# Patient Record
Sex: Female | Born: 1975 | Race: White | Hispanic: No | Marital: Single | Smoking: Heavy tobacco smoker
Health system: Southern US, Community
[De-identification: ages and names within clinical notes are randomized; demographics above are authoritative.]

## PROBLEM LIST (undated history)

## (undated) DIAGNOSIS — M810 Age-related osteoporosis without current pathological fracture: Secondary | ICD-10-CM

## (undated) HISTORY — PX: ABDOMINAL HYSTERECTOMY: SHX81

---

## 2003-04-02 ENCOUNTER — Encounter: Payer: Self-pay | Admitting: Emergency Medicine

## 2003-04-02 ENCOUNTER — Emergency Department (HOSPITAL_COMMUNITY): Admission: EM | Admit: 2003-04-02 | Discharge: 2003-04-02 | Payer: Self-pay | Admitting: Emergency Medicine

## 2003-10-21 ENCOUNTER — Other Ambulatory Visit: Payer: Self-pay

## 2004-01-05 ENCOUNTER — Emergency Department (HOSPITAL_COMMUNITY): Admission: EM | Admit: 2004-01-05 | Discharge: 2004-01-05 | Payer: Self-pay | Admitting: Emergency Medicine

## 2004-11-17 ENCOUNTER — Ambulatory Visit: Payer: Self-pay | Admitting: Obstetrics and Gynecology

## 2004-12-03 ENCOUNTER — Emergency Department: Payer: Self-pay | Admitting: Emergency Medicine

## 2005-04-18 ENCOUNTER — Emergency Department: Payer: Self-pay | Admitting: Emergency Medicine

## 2006-07-08 ENCOUNTER — Emergency Department: Payer: Self-pay | Admitting: Emergency Medicine

## 2006-09-10 ENCOUNTER — Emergency Department: Payer: Self-pay | Admitting: Emergency Medicine

## 2007-01-31 ENCOUNTER — Emergency Department: Payer: Self-pay | Admitting: Unknown Physician Specialty

## 2007-04-17 ENCOUNTER — Emergency Department: Payer: Self-pay

## 2007-07-03 ENCOUNTER — Emergency Department: Payer: Self-pay | Admitting: Emergency Medicine

## 2008-12-08 ENCOUNTER — Emergency Department: Payer: Self-pay | Admitting: Emergency Medicine

## 2008-12-22 ENCOUNTER — Ambulatory Visit: Payer: Self-pay | Admitting: Family

## 2009-01-31 ENCOUNTER — Ambulatory Visit: Payer: Self-pay | Admitting: Family

## 2009-02-03 ENCOUNTER — Ambulatory Visit: Payer: Self-pay | Admitting: Family

## 2009-03-31 ENCOUNTER — Other Ambulatory Visit: Payer: Self-pay | Admitting: Family

## 2009-04-13 ENCOUNTER — Ambulatory Visit: Payer: Self-pay | Admitting: Family

## 2009-04-15 ENCOUNTER — Ambulatory Visit: Payer: Self-pay | Admitting: Family

## 2009-05-26 ENCOUNTER — Ambulatory Visit: Payer: Self-pay | Admitting: Family

## 2009-07-01 ENCOUNTER — Ambulatory Visit: Payer: Self-pay | Admitting: Internal Medicine

## 2009-07-14 ENCOUNTER — Emergency Department: Payer: Self-pay | Admitting: Emergency Medicine

## 2010-07-07 ENCOUNTER — Ambulatory Visit: Payer: Self-pay | Admitting: Internal Medicine

## 2011-06-11 ENCOUNTER — Telehealth: Payer: Self-pay | Admitting: Internal Medicine

## 2011-06-11 DIAGNOSIS — L819 Disorder of pigmentation, unspecified: Secondary | ICD-10-CM

## 2011-06-11 NOTE — Telephone Encounter (Signed)
Referral in EPIC,.  [facility].

## 2011-06-11 NOTE — Telephone Encounter (Signed)
Pt would like to be referred to dermatology she has white spots on her back.  She said she spoke to you about this at [facility].  Pt will go sign medical release form

## 2011-06-11 NOTE — Telephone Encounter (Signed)
Patient is asking for a referral to dermatologist for white spots on her back. She says that she has talked about this with you over at [facility].

## 2011-06-11 NOTE — Telephone Encounter (Signed)
Referral ordered in epic for dermatologic evaluation requested by patient.  She has not been seen in this clinic yet but is transferring from old practice.  Pls refer to [facility]  Reason : hypopigmented skin lesions thanks

## 2011-06-25 ENCOUNTER — Encounter: Payer: Self-pay | Admitting: Internal Medicine

## 2011-11-28 DIAGNOSIS — G8929 Other chronic pain: Secondary | ICD-10-CM | POA: Insufficient documentation

## 2011-11-28 DIAGNOSIS — M549 Dorsalgia, unspecified: Secondary | ICD-10-CM | POA: Insufficient documentation

## 2011-12-04 ENCOUNTER — Ambulatory Visit: Payer: Self-pay | Admitting: Specialist

## 2012-01-02 ENCOUNTER — Ambulatory Visit: Payer: Self-pay | Admitting: Pain Medicine

## 2012-01-03 ENCOUNTER — Ambulatory Visit: Payer: Self-pay | Admitting: Pain Medicine

## 2012-01-07 ENCOUNTER — Ambulatory Visit: Payer: Self-pay | Admitting: Pain Medicine

## 2013-02-24 ENCOUNTER — Emergency Department: Payer: Self-pay | Admitting: Internal Medicine

## 2013-02-24 LAB — URINALYSIS, COMPLETE
Bacteria: NONE SEEN
Bilirubin,UR: NEGATIVE
Blood: NEGATIVE
Glucose,UR: NEGATIVE mg/dL (ref 0–75)
Ketone: NEGATIVE
Leukocyte Esterase: NEGATIVE
Nitrite: NEGATIVE
Ph: 9 (ref 4.5–8.0)
RBC,UR: 1 /HPF (ref 0–5)
Specific Gravity: 1.005 (ref 1.003–1.030)
Squamous Epithelial: NONE SEEN

## 2013-02-24 LAB — CBC
MCHC: 35.7 g/dL (ref 32.0–36.0)
Platelet: 280 10*3/uL (ref 150–440)
RBC: 4.65 10*6/uL (ref 3.80–5.20)

## 2013-02-24 LAB — PROTIME-INR: INR: 1

## 2013-02-24 LAB — COMPREHENSIVE METABOLIC PANEL
Albumin: 4.4 g/dL (ref 3.4–5.0)
Anion Gap: 6 — ABNORMAL LOW (ref 7–16)
Bilirubin,Total: 0.3 mg/dL (ref 0.2–1.0)
Calcium, Total: 9.7 mg/dL (ref 8.5–10.1)
Co2: 26 mmol/L (ref 21–32)
Creatinine: 1.07 mg/dL (ref 0.60–1.30)
EGFR (African American): >60
EGFR (Non-African Amer.): >60
Glucose: 101 mg/dL — ABNORMAL HIGH (ref 65–99)
Total Protein: 8.2 g/dL (ref 6.4–8.2)

## 2013-02-24 IMAGING — CR DG THORACIC SPINE 2-3V
1 series · 2 of 2 positions shown · non-contrast
Comparison: none

REASON FOR EXAM: UPPER BACK PAIN XRAY TSPINE
COMMENTS:

PROCEDURE:     DXR - DXR THORACIC  AP AND LATERAL  - January 07, 2012  [DATE]
RESULT:     Alignment of the thoracic spine is normal. The vertebral body
heights and intervertebral disc spaces appear normal. There does not appear
to be significant change.

[Series 1: ap · 0.17mm/px · 2 of 2 slices shown]
[im 1/2]
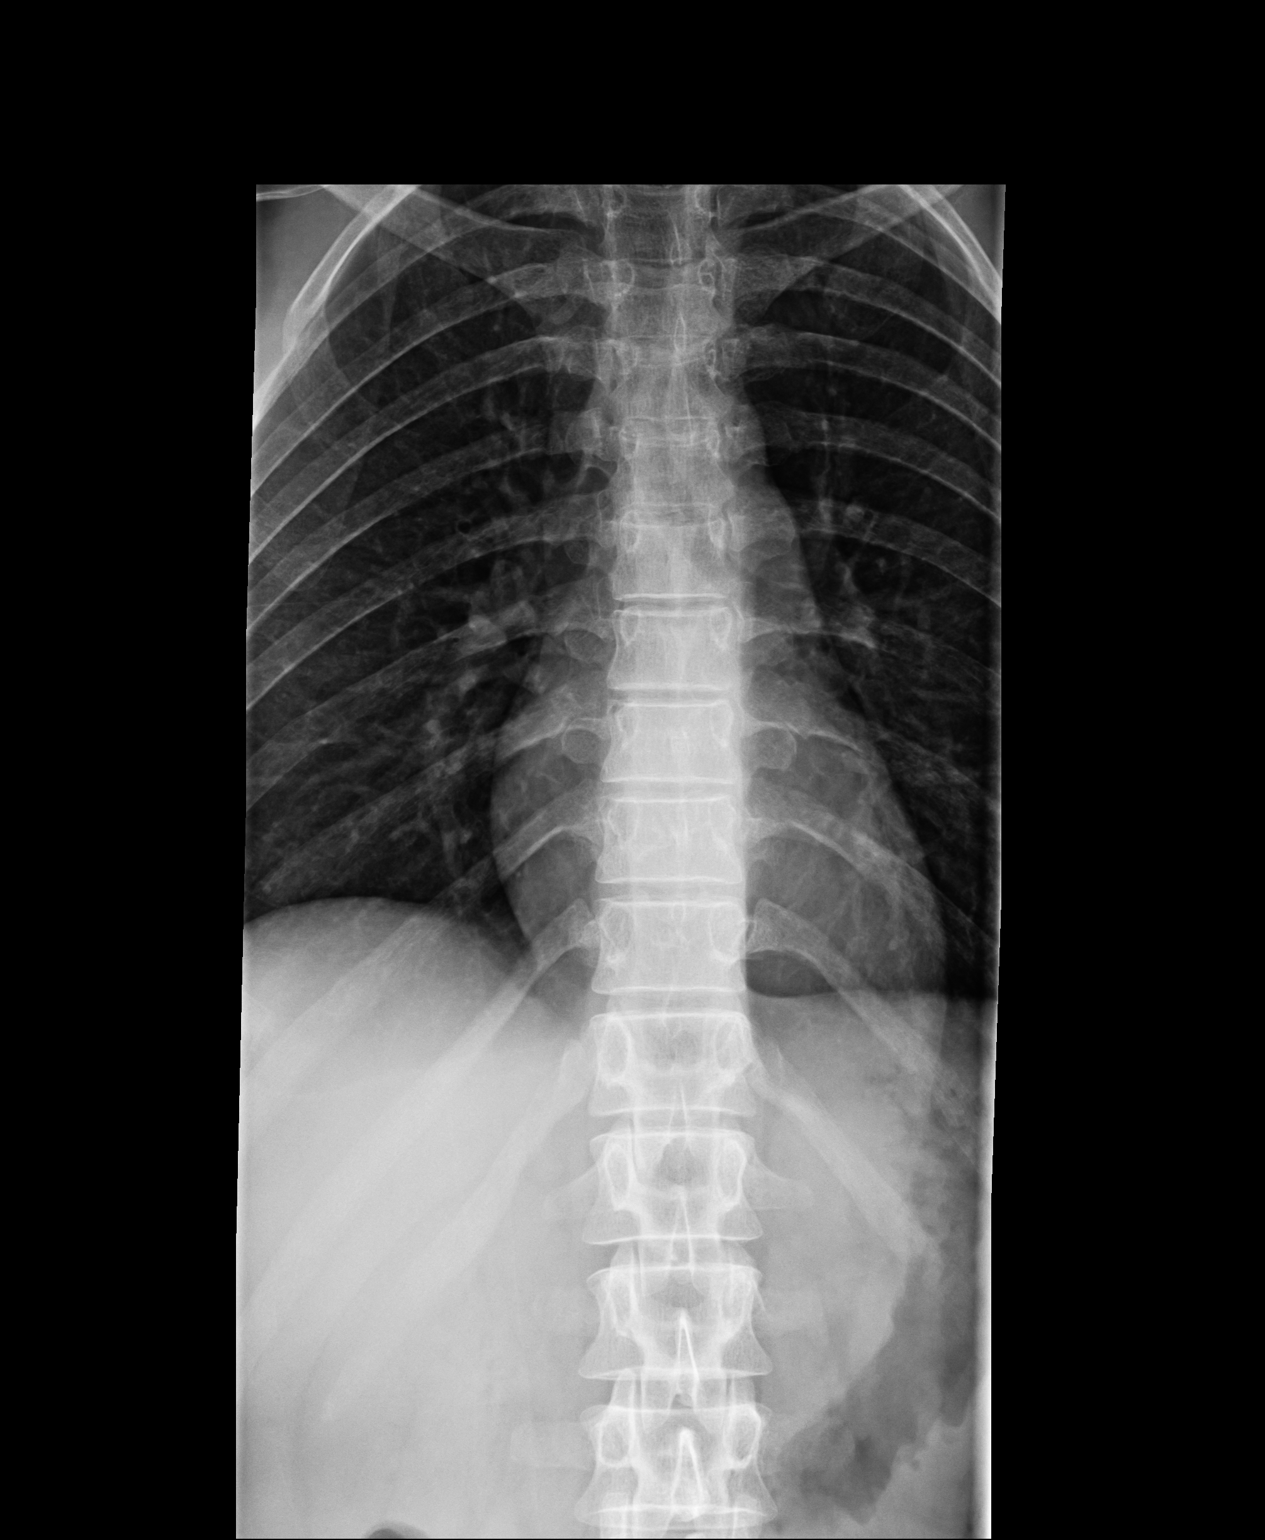
[im 2/2]
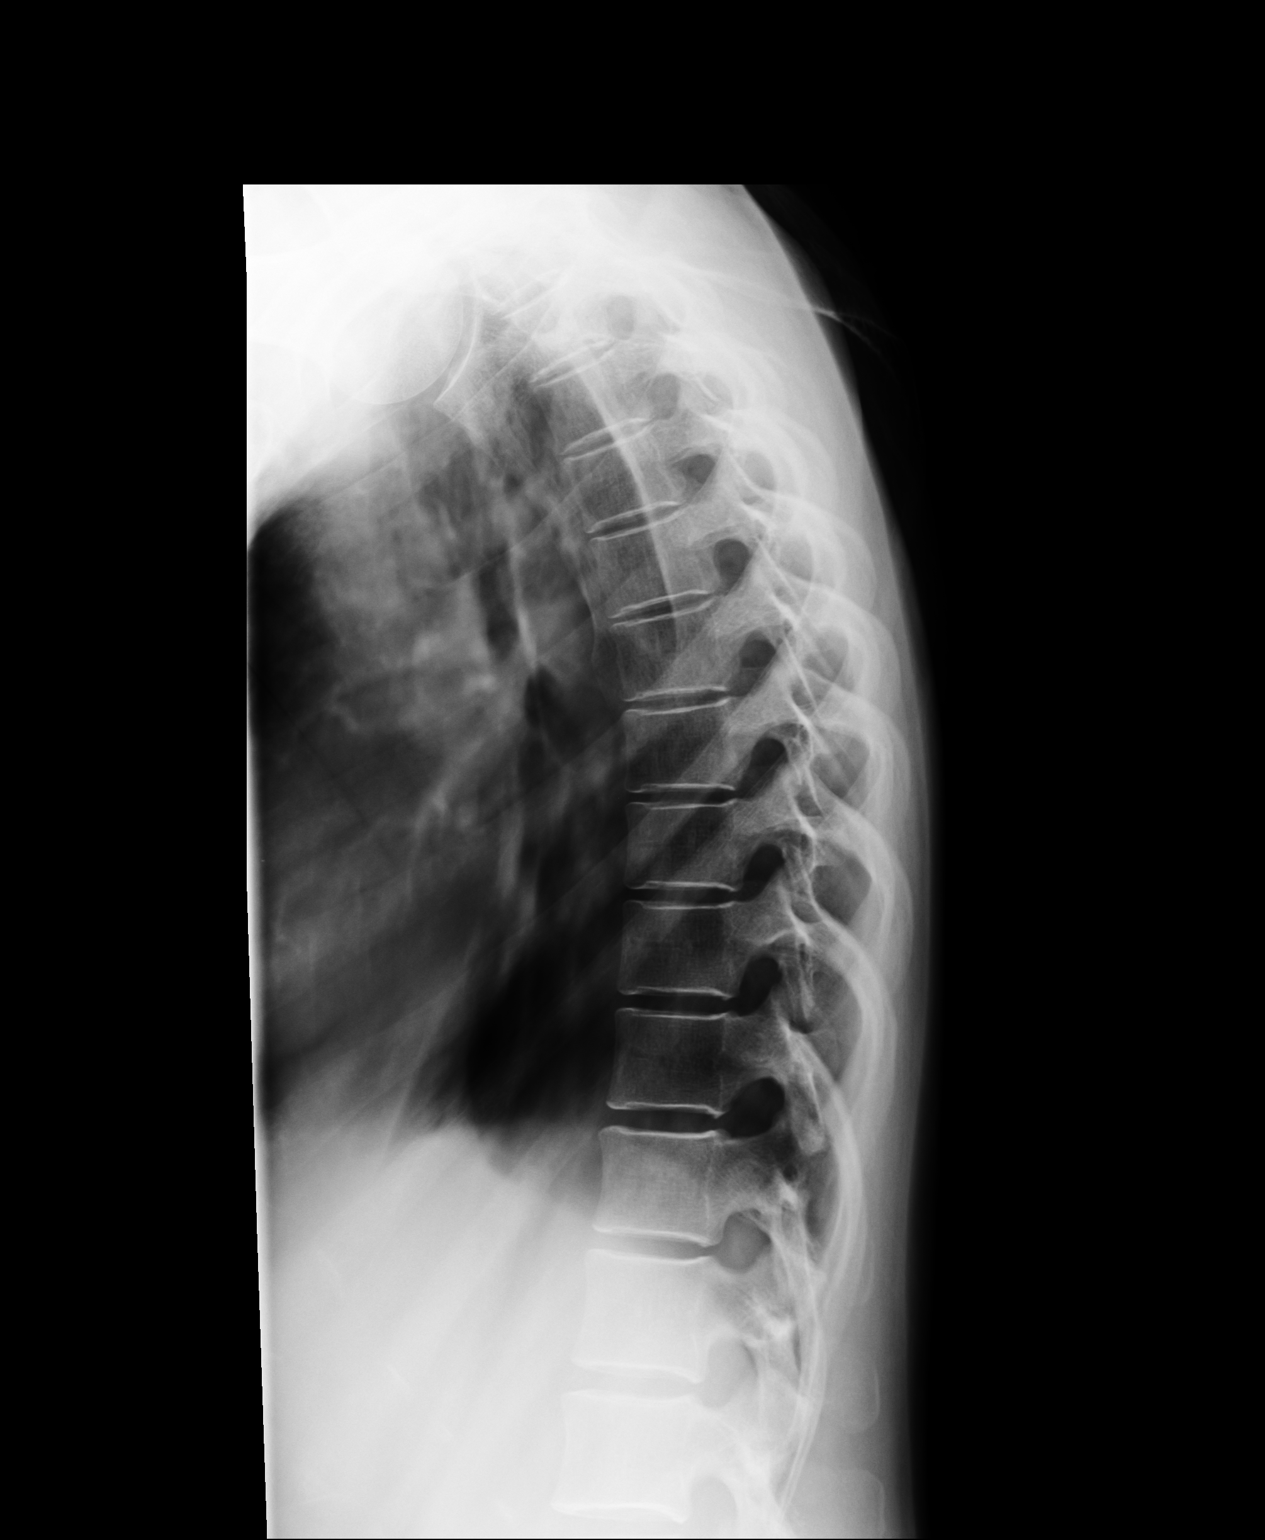

[2 of 2 positions shown; findings below may reference images not displayed]

IMPRESSION: Please see above.

[REDACTED]

## 2013-02-28 ENCOUNTER — Emergency Department: Payer: Self-pay | Admitting: Emergency Medicine

## 2014-07-29 ENCOUNTER — Ambulatory Visit: Payer: Self-pay

## 2014-10-21 ENCOUNTER — Emergency Department: Payer: Self-pay | Admitting: Emergency Medicine

## 2014-10-21 LAB — COMPREHENSIVE METABOLIC PANEL
ALBUMIN: 4.2 g/dL (ref 3.4–5.0)
ALT: 16 U/L (ref 14–63)
Alkaline Phosphatase: 101 U/L (ref 46–116)
Anion Gap: 8 (ref 7–16)
BUN: 13 mg/dL (ref 7–18)
Bilirubin,Total: 0.5 mg/dL (ref 0.2–1.0)
CALCIUM: 8.9 mg/dL (ref 8.5–10.1)
Chloride: 107 mmol/L (ref 98–107)
Co2: 24 mmol/L (ref 21–32)
Creatinine: 0.88 mg/dL (ref 0.60–1.30)
EGFR (African American): >60
EGFR (Non-African Amer.): >60
GLUCOSE: 142 mg/dL — AB (ref 65–99)
Osmolality: 280 (ref 275–301)
POTASSIUM: 3.5 mmol/L (ref 3.5–5.1)
SGOT(AST): 17 U/L (ref 15–37)
Sodium: 139 mmol/L (ref 136–145)
Total Protein: 7.3 g/dL (ref 6.4–8.2)

## 2014-10-21 LAB — CBC
HCT: 40.2 % (ref 35.0–47.0)
HGB: 13.8 g/dL (ref 12.0–16.0)
MCH: 31.6 pg (ref 26.0–34.0)
MCHC: 34.4 g/dL (ref 32.0–36.0)
MCV: 92 fL (ref 80–100)
Platelet: 275 10*3/uL (ref 150–440)
RBC: 4.37 10*6/uL (ref 3.80–5.20)
RDW: 12.6 % (ref 11.5–14.5)
WBC: 10.1 10*3/uL (ref 3.6–11.0)

## 2014-11-30 DIAGNOSIS — IMO0002 Reserved for concepts with insufficient information to code with codable children: Secondary | ICD-10-CM | POA: Insufficient documentation

## 2014-11-30 DIAGNOSIS — F0781 Postconcussional syndrome: Secondary | ICD-10-CM | POA: Insufficient documentation

## 2015-02-10 ENCOUNTER — Emergency Department
Admission: EM | Admit: 2015-02-10 | Discharge: 2015-02-10 | Disposition: A | Discharge status: Home / Self Care | Payer: Medicaid Other | Attending: Emergency Medicine | Admitting: Emergency Medicine

## 2015-02-10 ENCOUNTER — Encounter: Payer: Self-pay | Admitting: Emergency Medicine

## 2015-02-10 DIAGNOSIS — H6693 Otitis media, unspecified, bilateral: Secondary | ICD-10-CM | POA: Diagnosis not present

## 2015-02-10 DIAGNOSIS — Z720 Tobacco use: Secondary | ICD-10-CM | POA: Insufficient documentation

## 2015-02-10 DIAGNOSIS — H6692 Otitis media, unspecified, left ear: Secondary | ICD-10-CM

## 2015-02-10 DIAGNOSIS — H6691 Otitis media, unspecified, right ear: Secondary | ICD-10-CM

## 2015-02-10 DIAGNOSIS — H9203 Otalgia, bilateral: Secondary | ICD-10-CM | POA: Diagnosis present

## 2015-02-10 MED ORDER — DIPHENHYDRAMINE HCL 25 MG PO CAPS
ORAL_CAPSULE | ORAL | Status: AC
  Administered 2015-02-10: 25 mg via ORAL
  Filled 2015-02-10: qty 1

## 2015-02-10 MED ORDER — PREDNISONE 10 MG (21) PO TBPK: ORAL_TABLET | ORAL | Status: DC

## 2015-02-10 MED ORDER — PREDNISONE 20 MG PO TABS
ORAL_TABLET | ORAL | Status: AC
  Administered 2015-02-10: 60 mg via ORAL
  Filled 2015-02-10: qty 3

## 2015-02-10 MED ORDER — DIPHENHYDRAMINE HCL 25 MG PO CAPS
25.0000 mg | ORAL_CAPSULE | Freq: Once | ORAL | Status: AC
  Administered 2015-02-10: 25 mg via ORAL

## 2015-02-10 MED ORDER — PREDNISONE 20 MG PO TABS
60.0000 mg | ORAL_TABLET | Freq: Once | ORAL | Status: AC
  Administered 2015-02-10: 60 mg via ORAL

## 2015-02-10 MED ORDER — OXYMETAZOLINE HCL 0.05 % NA SOLN: 2.0000 | Freq: Two times a day (BID) | NASAL | Status: AC

## 2015-02-10 MED ORDER — AMOXICILLIN-POT CLAVULANATE 875-125 MG PO TABS
ORAL_TABLET | ORAL | Status: AC
  Administered 2015-02-10: 1 via ORAL
  Filled 2015-02-10: qty 1

## 2015-02-10 MED ORDER — AMOXICILLIN-POT CLAVULANATE 875-125 MG PO TABS
1.0000 | ORAL_TABLET | Freq: Once | ORAL | Status: AC
  Administered 2015-02-10: 1 via ORAL

## 2015-02-10 MED ORDER — LORATADINE-PSEUDOEPHEDRINE ER 5-120 MG PO TB12: 1.0000 | ORAL_TABLET | Freq: Two times a day (BID) | ORAL | Status: AC

## 2015-02-10 MED ORDER — AMOXICILLIN-POT CLAVULANATE 875-125 MG PO TABS: 1.0000 | ORAL_TABLET | Freq: Two times a day (BID) | ORAL | Status: AC

## 2015-02-10 NOTE — ED Notes (Signed)
Pt to triage states having difficulty hearing from right ear.  Pt states recent treatment for ear infection.  States had drainage, but that has decreased

## 2015-02-10 NOTE — ED Provider Notes (Signed)
CSN: [id number]     Arrival date & time 02/10/15  1938 History   First MD Initiated Contact with Patient 02/10/15 1956     Chief Complaint  Patient presents with  . Otalgia     (Consider location/radiation/quality/duration/timing/severity/associated sxs/prior Treatment) HPI Patient presents today with about a 3 day worsening fullness in both ears worse now in the left and right states she was diagnosed with an ear infection over a month ago has been on multiple therapies from her primary care provider and just keeps coming back denies any other complaints this time rates pain as about 6 out of 10 nothing seemingly making it better or worse and currently denies any other symptoms except some subjective fever and fullness which seems to make it more difficult to hear out of her right ear History reviewed. No pertinent past medical history. Past Surgical History  Procedure Laterality Date  . Abdominal hysterectomy     History reviewed. No pertinent family history. History  Substance Use Topics  . Smoking status: Heavy Tobacco Smoker  . Smokeless tobacco: Not on file  . Alcohol Use: No   OB History    No data available     Review of Systems  Constitutional: Negative.   Eyes: Negative.   Respiratory: Negative.   Cardiovascular: Negative.   Musculoskeletal: Negative.   Skin: Negative.   All other systems reviewed and are negative.     Allergies  Review of patient's allergies indicates no known allergies.  Home Medications   Prior to Admission medications   Medication Sig Start Date End Date Taking? Authorizing Provider  alendronate (FOSAMAX) 35 MG tablet Take 10 mg by mouth every 7 (seven) days. Take with a full glass of water on an empty stomach.   Yes Historical Provider, MD  amoxicillin-clavulanate (AUGMENTIN) 875-125 MG per tablet Take 1 tablet by mouth every 12 (twelve) hours. 02/10/15 02/20/15  Javoni Lucken William C Lenny Fiumara, PA-C  loratadine-pseudoephedrine (CLARITIN-D 12 HOUR)  5-120 MG per tablet Take 1 tablet by mouth 2 (two) times daily. 02/10/15 02/10/16  Macarthur Lorusso William C Cavon Nicolls, PA-C  oxymetazoline (AFRIN) 0.05 % nasal spray Place 2 sprays into both nostrils 2 (two) times daily. 02/10/15 02/13/15  Shelbi Vaccaro William C Elden Brucato, PA-C  predniSONE (STERAPRED UNI-PAK 21 TAB) 10 MG (21) TBPK tablet Take 6 tablets on day 1 Take 5 tablets on day 2 Take 4 tablets on day 3 Take 3 tablets on day 4 Take 2 tablets on day 5 Take 1 tablet on day 6 02/10/15   [place] Nolton William C Terald Jump, PA-C   BP 133/91 mmHg  Pulse 87  Temp(Src) 98.3 F (36.8 C)  Resp 18  Ht 5' (1.524 m)  Wt 95 lb (43.092 kg)  BMI 18.55 kg/m2  SpO2 99% Physical Exam Female appearing stated age well-developed well-nourished no acute distress Vitals reviewed Head ears eyes nose neck and throat examination the patient reveals bilateral erythematous bulging tympanic membranes Cardiovascular regular rate and rhythm no murmurs rubs gallops Pulmonary lungs clear to auscultation bilaterally Skin free of rash or disease Neuro exam nonfocal cranial nerves II through XII grossly intact ED Course  Procedures   MDM  Decision making on this patient patient is had a recurrent otitis media and now says both her ears are full her primary care provider tried on some medications that did not work she's had some subjective fever chills increase in pain started started on some anabiotic decongest and have her follow-up with ear nose and throat synthesis recurred over  the last couple months return here for any acute concerns or worsening symptoms Final diagnoses:  Recurrent acute otitis media of both ears, unspecified otitis media type        Indyah Saulnier Rosalyn Gess, PA-C 02/10/15 2041  Phineas Semen, MD 02/10/15 2232

## 2015-05-28 ENCOUNTER — Emergency Department
Admission: EM | Admit: 2015-05-28 | Discharge: 2015-05-28 | Disposition: A | Discharge status: Home / Self Care | Payer: Medicaid Other | Attending: Emergency Medicine | Admitting: Emergency Medicine

## 2015-05-28 DIAGNOSIS — Y998 Other external cause status: Secondary | ICD-10-CM | POA: Insufficient documentation

## 2015-05-28 DIAGNOSIS — Y9289 Other specified places as the place of occurrence of the external cause: Secondary | ICD-10-CM | POA: Diagnosis not present

## 2015-05-28 DIAGNOSIS — Y9389 Activity, other specified: Secondary | ICD-10-CM | POA: Insufficient documentation

## 2015-05-28 DIAGNOSIS — W260XXA Contact with knife, initial encounter: Secondary | ICD-10-CM | POA: Insufficient documentation

## 2015-05-28 DIAGNOSIS — Z720 Tobacco use: Secondary | ICD-10-CM | POA: Insufficient documentation

## 2015-05-28 DIAGNOSIS — Z7952 Long term (current) use of systemic steroids: Secondary | ICD-10-CM | POA: Diagnosis not present

## 2015-05-28 DIAGNOSIS — S61211A Laceration without foreign body of left index finger without damage to nail, initial encounter: Secondary | ICD-10-CM | POA: Diagnosis present

## 2015-05-28 DIAGNOSIS — Z79899 Other long term (current) drug therapy: Secondary | ICD-10-CM | POA: Insufficient documentation

## 2015-05-28 MED ORDER — OXYCODONE-ACETAMINOPHEN 5-325 MG PO TABS
1.0000 | ORAL_TABLET | Freq: Once | ORAL | Status: AC
  Administered 2015-05-28: 1 via ORAL
  Filled 2015-05-28: qty 1

## 2015-05-28 MED ORDER — LIDOCAINE HCL (PF) 1 % IJ SOLN
INTRAMUSCULAR | Status: AC
  Administered 2015-05-28: 21:00:00
  Filled 2015-05-28: qty 5

## 2015-05-28 MED ORDER — LIDOCAINE-EPINEPHRINE-TETRACAINE (LET) SOLUTION
3.0000 mL | Freq: Once | NASAL | Status: AC
  Administered 2015-05-28: 3 mL via TOPICAL
  Filled 2015-05-28: qty 3

## 2015-05-28 MED ORDER — OXYCODONE-ACETAMINOPHEN 7.5-325 MG PO TABS: 1.0000 | ORAL_TABLET | Freq: Four times a day (QID) | ORAL | Status: DC | PRN

## 2015-05-28 MED ORDER — BACITRACIN-NEOMYCIN-POLYMYXIN 400-5-5000 EX OINT
TOPICAL_OINTMENT | Freq: Once | CUTANEOUS | Status: AC
  Administered 2015-05-28: 1 via TOPICAL
  Filled 2015-05-28: qty 1

## 2015-05-28 NOTE — ED Notes (Addendum)
Pt. Was cutting with a "chef's butter knife" and sliced left pointer finger.  Patient complains of pain of 10 on 0-10, patient states that has not taken anything for the pain.  Patient is able to straighten finger slowly but, does not want to curl in a ball complains of pain.  Patient reports that there is not a loss of feeling of the finger.

## 2015-05-28 NOTE — ED Notes (Signed)
Pt states daughter is in care. Per patient, states she is not going to drive.

## 2015-05-28 NOTE — ED Notes (Signed)
Applied LET solution to patients left pointer finger.

## 2015-05-28 NOTE — ED Provider Notes (Signed)
[facility] Emergency Department Provider Note  ____________________________________________  Time seen: Approximately 8:29 PM  I have reviewed the triage vital signs and the nursing notes.   HISTORY  Chief Complaint Laceration    HPI Sandra Reeves is a 39 y.o. female patient has a laceration to the index finger of the left hand. Patient cut her finger such shaft 100 knife. Patient is complaining of pain of 8/10. Patient state there is moderate hemorrhaging which finally controlled with direct pressure. He denies any loss sensation or loss of function of the finger. Patient is right-hand dominant. Patient said her tetanus shot is up-to-date.   History reviewed. No pertinent past medical history.  There are no active problems to display for this patient.   Past Surgical History  Procedure Laterality Date  . Abdominal hysterectomy      Current Outpatient Rx  Name  Route  Sig  Dispense  Refill  . alendronate (FOSAMAX) 35 MG tablet   Oral   Take 10 mg by mouth every 7 (seven) days. Take with a full glass of water on an empty stomach.         . loratadine-pseudoephedrine (CLARITIN-D 12 HOUR) 5-120 MG per tablet   Oral   Take 1 tablet by mouth 2 (two) times daily.   20 tablet   0   . oxyCODONE-acetaminophen (PERCOCET) 7.5-325 MG per tablet   Oral   Take 1 tablet by mouth every 6 (six) hours as needed for severe pain.   12 tablet   0   . predniSONE (STERAPRED UNI-PAK 21 TAB) 10 MG (21) TBPK tablet      Take 6 tablets on day 1 Take 5 tablets on day 2 Take 4 tablets on day 3 Take 3 tablets on day 4 Take 2 tablets on day 5 Take 1 tablet on day 6   21 tablet   0     Allergies Review of patient's allergies indicates no known allergies.  No family history on file.  Social History Social History  Substance Use Topics  . Smoking status: Heavy Tobacco Smoker -- 1.00 packs/day    Types: Cigarettes  . Smokeless tobacco: None  . Alcohol  Use: No    Review of Systems Constitutional: No fever/chills Eyes: No visual changes. ENT: No sore throat. Cardiovascular: Denies chest pain. Respiratory: Denies shortness of breath. Gastrointestinal: No abdominal pain.  No nausea, no vomiting.  No diarrhea.  No constipation. Genitourinary: Negative for dysuria. Musculoskeletal: Negative for back pain. Skin: Negative for rash. Neurological: Negative for headaches, focal weakness or numbness. {10-point ROS otherwise negative.  ____________________________________________   PHYSICAL EXAM:  VITAL SIGNS: ED Triage Vitals  Enc Vitals Group     BP 05/28/15 1925 128/84 mmHg     Pulse --      Resp 05/28/15 1925 16     Temp 05/28/15 1925 98.2 F (36.8 C)     Temp Source 05/28/15 1925 Oral     SpO2 05/28/15 1925 99 %     Weight 05/28/15 1925 95 lb (43.092 kg)     Height 05/28/15 1925 5' (1.524 m)     Head Cir --      Peak Flow --      Pain Score --      Pain Loc --      Pain Edu? --      Excl. in GC? --     Constitutional: Alert and oriented. Well appearing and in no acute distress. Patient  is anxious Eyes: Conjunctivae are normal. PERRL. EOMI. Head: Atraumatic. Nose: No congestion/rhinnorhea. Mouth/Throat: Mucous membranes are moist.  Oropharynx non-erythematous. Neck: No stridor.   Cardiovascular: Normal rate, regular rhythm. Grossly normal heart sounds.  Good peripheral circulation. Respiratory: Normal respiratory effort.  No retractions. Lungs CTAB. Gastrointestinal: Soft and nontender. No distention. No abdominal bruits. No CVA tenderness. Musculoskeletal: No lower extremity tenderness nor edema.  No joint effusions. Neurologic:  Normal speech and language. No gross focal neurologic deficits are appreciated. No gait instability. Skin:  Skin is warm, dry and intact. No rash noted. Laceration to the left index finger hemorrhage is controlled. Psychiatric: Mood and affect are normal. Speech and behavior are  normal.  ____________________________________________   LABS (all labs ordered are listed, but only abnormal results are displayed)  Labs Reviewed - No data to display ____________________________________________  EKG   ____________________________________________  RADIOLOGY   ____________________________________________   PROCEDURES  Procedure(s) performed: See procedure note  Critical Care performed: No  _________________________LACERATION REPAIR Performed by: Joni Reining Authorized by: Joni Reining Consent: Verbal consent obtained. Risks and benefits: risks, benefits and alternatives were discussed Consent given by: patient Patient identity confirmed: provided demographic data Prepped and Draped in normal sterile fashion Wound explored  Laceration Location: Left index finger  Laceration Length: 2 cm  No Foreign Bodies seen or palpated  Anesthesia: local infiltration: Digital block   Local anesthetic: Lidocaine 1% without  epinephrine Anesthetic total: 4 mL Irrigation method: syringe Amount of cleaning: standard  Skin closure: 4-0 nylon   Number of sutures: 5 Technique: Interrupted Patient tolerance: Patient tolerated the procedure well with no immediate complications. ___________________   INITIAL IMPRESSION / ASSESSMENT AND PLAN / ED COURSE  Pertinent labs & imaging results that were available during my care of the patient were reviewed by me and considered in my medical decision making (see chart for details).  Left index finger laceration. Area was sutured and patient and advice on home care. Patient advised return back in 10 days for suture removal. Patient was sent back to ER if her condition worsens. Patient discharged prescription for Percocets for 3 days. ____________________________________________   FINAL CLINICAL IMPRESSION(S) / ED DIAGNOSES  Final diagnoses:  Laceration of left index finger w/o foreign body w/o damage to nail,  initial encounter      Joni Reining, PA-C 05/28/15 2041  Myrna Blazer, MD 05/29/15 0001

## 2015-06-08 ENCOUNTER — Emergency Department
Admission: EM | Admit: 2015-06-08 | Discharge: 2015-06-08 | Disposition: A | Discharge status: Home / Self Care | Payer: Medicaid Other | Attending: Emergency Medicine | Admitting: Emergency Medicine

## 2015-06-08 ENCOUNTER — Encounter: Payer: Self-pay | Admitting: Emergency Medicine

## 2015-06-08 DIAGNOSIS — Z79899 Other long term (current) drug therapy: Secondary | ICD-10-CM | POA: Insufficient documentation

## 2015-06-08 DIAGNOSIS — Z4802 Encounter for removal of sutures: Secondary | ICD-10-CM

## 2015-06-08 DIAGNOSIS — Z7952 Long term (current) use of systemic steroids: Secondary | ICD-10-CM | POA: Diagnosis not present

## 2015-06-08 DIAGNOSIS — Z720 Tobacco use: Secondary | ICD-10-CM | POA: Insufficient documentation

## 2015-06-08 NOTE — Discharge Instructions (Signed)
°  Scar Minimization °You will have a scar anytime you have surgery and a cut is made in the skin or you have something removed from your skin (mole, skin cancer, cyst). Although scars are unavoidable following surgery, there are ways to minimize their appearance. °It is important to follow all the instructions you receive from your caregiver about wound care. How your wound heals will influence the appearance of your scar. If you do not follow the wound care instructions as directed, complications such as infection may occur. Wound instructions include keeping the wound clean, moist, and not letting the wound form a scab. Some people form scars that are raised and lumpy (hypertrophic) or larger than the initial wound (keloidal). °HOME CARE INSTRUCTIONS  °· Follow wound care instructions as directed. °· Keep the wound clean by washing it with soap and water. °· Keep the wound moist with provided antibiotic cream or petroleum jelly until completely healed. Moisten twice a day for about 2 weeks. °· Get stitches (sutures) taken out at the scheduled time. °· Avoid touching or manipulating your wound unless needed. Wash your hands thoroughly before and after touching your wound. °· Follow all restrictions such as limits on exercise or work. This depends on where your scar is located. °· Keep the scar protected from sunburn. Cover the scar with sunscreen/sunblock with SPF 30 or higher. °· Gently massage the scar using a circular motion to help minimize the appearance of the scar. Do this only after the wound has closed and all the sutures have been removed. °· For hypertrophic or keloidal scars, there are several ways to treat and minimize their appearance. Methods include compression therapy, intralesional corticosteroids, laser therapy, or surgery. These methods are performed by your caregiver. °Remember that the scar may appear lighter or darker than your normal skin color. This difference in color should even out with  time. °SEEK MEDICAL CARE IF:  °· You have a fever. °· You develop signs of infection such as pain, redness, pus, and warmth. °· You have questions or concerns. °Document Released: 02/21/2010 Document Revised: 11/26/2011 Document Reviewed: 02/21/2010 °ExitCare® Patient Information ©2015 [employer]. This information is not intended to replace advice given to you by your health care provider. Make sure you discuss any questions you have with your health care provider. ° ° °

## 2015-06-08 NOTE — ED Provider Notes (Signed)
[facility] Emergency Department Provider Note  ____________________________________________  Time seen: Approximately 3:50 PM  I have reviewed the triage vital signs and the nursing notes.   HISTORY  Chief Complaint Suture / Staple Removal    HPI Sandra Reeves is a 39 y.o. female turns to the emergency room for removal of sutures out of her left index finger. She states that she has not had any increased pain, swelling, redness, or drainage. No other complaints.   History reviewed. No pertinent past medical history.  There are no active problems to display for this patient.   Past Surgical History  Procedure Laterality Date  . Abdominal hysterectomy      Current Outpatient Rx  Name  Route  Sig  Dispense  Refill  . alendronate (FOSAMAX) 35 MG tablet   Oral   Take 10 mg by mouth every 7 (seven) days. Take with a full glass of water on an empty stomach.         . loratadine-pseudoephedrine (CLARITIN-D 12 HOUR) 5-120 MG per tablet   Oral   Take 1 tablet by mouth 2 (two) times daily.   20 tablet   0   . oxyCODONE-acetaminophen (PERCOCET) 7.5-325 MG per tablet   Oral   Take 1 tablet by mouth every 6 (six) hours as needed for severe pain.   12 tablet   0   . predniSONE (STERAPRED UNI-PAK 21 TAB) 10 MG (21) TBPK tablet      Take 6 tablets on day 1 Take 5 tablets on day 2 Take 4 tablets on day 3 Take 3 tablets on day 4 Take 2 tablets on day 5 Take 1 tablet on day 6   21 tablet   0     Allergies Review of patient's allergies indicates no known allergies.  No family history on file.  Social History Social History  Substance Use Topics  . Smoking status: Heavy Tobacco Smoker -- 1.00 packs/day    Types: Cigarettes  . Smokeless tobacco: None  . Alcohol Use: No    Review of Systems Constitutional: No fever/chills Eyes: No visual changes. ENT: No sore throat. Cardiovascular: Denies chest pain. Respiratory: Denies shortness  of breath. Gastrointestinal: No abdominal pain.  No nausea, no vomiting.  No diarrhea.  No constipation. Genitourinary: Negative for dysuria. Musculoskeletal: Negative for back pain. Skin: Negative for rash. Sutured laceration to left index finger Neurological: Negative for headaches, focal weakness or numbness.  10-point ROS otherwise negative.  ____________________________________________   PHYSICAL EXAM:  VITAL SIGNS: ED Triage Vitals  Enc Vitals Group     BP 06/08/15 1548 126/79 mmHg     Pulse Rate 06/08/15 1548 70     Resp 06/08/15 1548 20     Temp 06/08/15 1548 98.2 F (36.8 C)     Temp Source 06/08/15 1548 Oral     SpO2 06/08/15 1548 100 %     Weight 06/08/15 1548 95 lb (43.092 kg)     Height 06/08/15 1548 5' (1.524 m)     Head Cir --      Peak Flow --      Pain Score --      Pain Loc --      Pain Edu? --      Excl. in GC? --     Constitutional: Alert and oriented. Well appearing and in no acute distress. Eyes: Conjunctivae are normal. PERRL. EOMI. Head: Atraumatic. Nose: No congestion/rhinnorhea. Mouth/Throat: Mucous membranes are moist.  Oropharynx non-erythematous.  Neck: No stridor.   Cardiovascular: Normal rate, regular rhythm. Grossly normal heart sounds.  Good peripheral circulation. Respiratory: Normal respiratory effort.  No retractions. Lungs CTAB. Gastrointestinal: Soft and nontender. No distention. No abdominal bruits. No CVA tenderness. Musculoskeletal: No lower extremity tenderness nor edema.  No joint effusions. Sutured laceration to the left lateral index finger approximately 2 cm in length. It is closed with 5 simple interrupted sutures. No signs of edema, erythema, or drainage. Neurologic:  Normal speech and language. No gross focal neurologic deficits are appreciated. No gait instability. Skin:  Skin is warm, dry and intact. No rash noted. Psychiatric: Mood and affect are normal. Speech and behavior are  normal.  ____________________________________________   LABS (all labs ordered are listed, but only abnormal results are displayed)  Labs Reviewed - No data to display ____________________________________________  EKG   ____________________________________________  RADIOLOGY   ____________________________________________   PROCEDURES  Procedure(s) performed: yes, suture removal, see procedure note(s).  SUTURE REMOVAL Performed by: Delorise Royals Cuthriell  Consent: Verbal consent obtained. Patient identity confirmed: provided demographic data Time out: Immediately prior to procedure a "time out" was called to verify the correct patient, procedure, equipment, support staff and site/side marked as required.  Location details: Left lateral index  Wound Appearance: clean  Sutures/Staples Removed: 5 sutures   No dehiscence after suture removal.   Facility: sutures placed in this facility Patient tolerance: Patient tolerated the procedure well with no immediate complications.     Critical Care performed: No  ____________________________________________   INITIAL IMPRESSION / ASSESSMENT AND PLAN / ED COURSE  Pertinent labs & imaging results that were available during my care of the patient were reviewed by me and considered in my medical decision making (see chart for details).  Patient presented to the emergency room for removal of sutures. No complications from same. Removed all 5 sutures with no dehiscence. No need for any follow-up or treatment. ____________________________________________   FINAL CLINICAL IMPRESSION(S) / ED DIAGNOSES  Final diagnoses:  Visit for suture removal        Racheal Patches, PA-C 06/08/15 1614  Sharyn Creamer, MD 06/08/15 2216

## 2015-06-08 NOTE — ED Notes (Signed)
Here for suture removal

## 2015-12-27 DIAGNOSIS — Z8739 Personal history of other diseases of the musculoskeletal system and connective tissue: Secondary | ICD-10-CM | POA: Insufficient documentation

## 2015-12-29 ENCOUNTER — Other Ambulatory Visit: Payer: Self-pay | Admitting: Family Medicine

## 2015-12-29 DIAGNOSIS — Z8739 Personal history of other diseases of the musculoskeletal system and connective tissue: Secondary | ICD-10-CM

## 2016-03-26 DIAGNOSIS — G8929 Other chronic pain: Secondary | ICD-10-CM | POA: Insufficient documentation

## 2016-05-15 ENCOUNTER — Other Ambulatory Visit: Payer: Self-pay | Admitting: Family Medicine

## 2016-06-20 DIAGNOSIS — M47817 Spondylosis without myelopathy or radiculopathy, lumbosacral region: Secondary | ICD-10-CM | POA: Insufficient documentation

## 2016-06-20 DIAGNOSIS — M7918 Myalgia, other site: Secondary | ICD-10-CM | POA: Insufficient documentation

## 2016-12-27 ENCOUNTER — Emergency Department: Payer: Medicaid Other

## 2016-12-27 ENCOUNTER — Emergency Department
Admission: EM | Admit: 2016-12-27 | Discharge: 2016-12-28 | Disposition: A | Discharge status: Home / Self Care | Payer: Medicaid Other | Attending: Emergency Medicine | Admitting: Emergency Medicine

## 2016-12-27 ENCOUNTER — Encounter: Payer: Self-pay | Admitting: *Deleted

## 2016-12-27 DIAGNOSIS — Y929 Unspecified place or not applicable: Secondary | ICD-10-CM | POA: Diagnosis not present

## 2016-12-27 DIAGNOSIS — Y999 Unspecified external cause status: Secondary | ICD-10-CM | POA: Diagnosis not present

## 2016-12-27 DIAGNOSIS — Y939 Activity, unspecified: Secondary | ICD-10-CM | POA: Diagnosis not present

## 2016-12-27 DIAGNOSIS — S8992XA Unspecified injury of left lower leg, initial encounter: Secondary | ICD-10-CM | POA: Diagnosis present

## 2016-12-27 DIAGNOSIS — Z79899 Other long term (current) drug therapy: Secondary | ICD-10-CM | POA: Insufficient documentation

## 2016-12-27 DIAGNOSIS — F17210 Nicotine dependence, cigarettes, uncomplicated: Secondary | ICD-10-CM | POA: Diagnosis not present

## 2016-12-27 DIAGNOSIS — S8002XA Contusion of left knee, initial encounter: Secondary | ICD-10-CM | POA: Diagnosis not present

## 2016-12-27 DIAGNOSIS — W010XXA Fall on same level from slipping, tripping and stumbling without subsequent striking against object, initial encounter: Secondary | ICD-10-CM | POA: Insufficient documentation

## 2016-12-27 DIAGNOSIS — S80212A Abrasion, left knee, initial encounter: Secondary | ICD-10-CM

## 2016-12-27 MED ORDER — NAPROXEN 500 MG PO TABS: 500.0000 mg | ORAL_TABLET | Freq: Two times a day (BID) | ORAL | 0 refills | Status: DC

## 2016-12-27 NOTE — ED Provider Notes (Signed)
[facility]-EMERGENCY DEPARTMENT Provider Note   CSN: [id number] Arrival date & time: 12/27/16  2230     History   Chief Complaint Chief Complaint  Patient presents with  . Knee Injury    HPI Sandra Reeves is a 41 y.o. female presents to the emergency department for evaluation of left knee pain. Patient tripped over her friend's dog just prior to arrival, fell directly onto her anterior left knee. Suffered some abrasions to the left knee. She has pain with ambulation. She denies any buckling or giving away. Pain is moderate. Currently on oxycodone 7.5/325 every 6 hours for chronic pain. Patient denies any other injury to her body. No head injury. No groin pain. No numbness tingling or radicular symptoms.  HPI  No past medical history on file.  There are no active problems to display for this patient.   Past Surgical History:  Procedure Laterality Date  . ABDOMINAL HYSTERECTOMY      OB History    No data available       Home Medications    Prior to Admission medications   Medication Sig Start Date End Date Taking? Authorizing Provider  alendronate (FOSAMAX) 35 MG tablet Take 10 mg by mouth every 7 (seven) days. Take with a full glass of water on an empty stomach.    Historical Provider, MD  naproxen (NAPROSYN) 500 MG tablet Take 1 tablet (500 mg total) by mouth 2 (two) times daily with a meal. 12/27/16   Evon Slack, PA-C  oxyCODONE-acetaminophen (PERCOCET) 7.5-325 MG per tablet Take 1 tablet by mouth every 6 (six) hours as needed for severe pain. 05/28/15   Joni Reining, PA-C  predniSONE (STERAPRED UNI-PAK 21 TAB) 10 MG (21) TBPK tablet Take 6 tablets on day 1 Take 5 tablets on day 2 Take 4 tablets on day 3 Take 3 tablets on day 4 Take 2 tablets on day 5 Take 1 tablet on day 6 02/10/15   III Rosalyn Gess, PA-C    Family History No family history on file.  Social History Social History  Substance Use Topics  . Smoking status: Heavy Tobacco Smoker   Packs/day: 1.00    Types: Cigarettes  . Smokeless tobacco: Never Used  . Alcohol use No     Allergies   Patient has no known allergies.   Review of Systems Review of Systems  Constitutional: Negative for activity change, chills, fatigue and fever.  HENT: Negative for congestion, sinus pressure and sore throat.   Eyes: Negative for visual disturbance.  Respiratory: Negative for cough, chest tightness and shortness of breath.   Cardiovascular: Negative for chest pain and leg swelling.  Gastrointestinal: Negative for abdominal pain, diarrhea, nausea and vomiting.  Genitourinary: Negative for dysuria.  Musculoskeletal: Positive for arthralgias and gait problem. Negative for joint swelling.  Skin: Positive for wound. Negative for rash.  Neurological: Negative for weakness, numbness and headaches.  Hematological: Negative for adenopathy.  Psychiatric/Behavioral: Negative for agitation, behavioral problems and confusion.     Physical Exam Updated Vital Signs BP 108/62 (BP Location: Left Arm)   Pulse 75   Temp 98.1 F (36.7 C) (Oral)   Resp 18   Ht  (1.549 m)   Wt 44 kg   SpO2 100%   BMI 18.33 kg/m   Physical Exam  Constitutional: She is oriented to person, place, and time. She appears well-developed and well-nourished. No distress.  HENT:  Head: Normocephalic and atraumatic.  Mouth/Throat: Oropharynx is clear and moist.  Eyes: EOM are normal. Pupils are equal, round, and reactive to light. Right eye exhibits no discharge. Left eye exhibits no discharge.  Neck: Normal range of motion. Neck supple.  Cardiovascular: Normal rate, regular rhythm and intact distal pulses.   Pulmonary/Chest: Effort normal and breath sounds normal. No respiratory distress. She exhibits no tenderness.  Abdominal: Soft. She exhibits no distension. There is no tenderness.  Musculoskeletal:  Examination of the left knee shows the patient has no warmth erythema or effusion. Knee is stable to  valgus and varus stress testing. She is able to straight leg raise. She has mild tenderness on the anterior patella with mild abrasions. Quads tendon and patellar tendon are intact. No edema throughout the lower extremity. She has full range of motion of the left hip with internal and external rotation. Negative Homans sign.  Neurological: She is alert and oriented to person, place, and time. She has normal reflexes.  Skin: Skin is warm and dry.  Psychiatric: She has a normal mood and affect. Her behavior is normal. Judgment and thought content normal.     ED Treatments / Results  Labs (all labs ordered are listed, but only abnormal results are displayed) Labs Reviewed - No data to display  EKG  EKG Interpretation None       Radiology Dg Knee Complete 4 Views Left  Result Date: 12/27/2016 CLINICAL DATA:  Tripped over dog and fell on gravel, with left knee pain and abrasion. Initial encounter. EXAM: LEFT KNEE - COMPLETE 4+ VIEW COMPARISON:  None. FINDINGS: There is no evidence of fracture or dislocation. The joint spaces are preserved. No significant degenerative change is seen; the patellofemoral joint is grossly unremarkable in appearance. No significant joint effusion is seen. The visualized soft tissues are normal in appearance. IMPRESSION: No evidence of fracture or dislocation. Electronically Signed   By: Roanna Raider M.D.   On: 12/27/2016 23:23    Procedures Procedures (including critical care time)  Medications Ordered in ED Medications - No data to display   Initial Impression / Assessment and Plan / ED Course  I have reviewed the triage vital signs and the nursing notes.  Pertinent labs & imaging results that were available during my care of the patient were reviewed by me and considered in my medical decision making (see chart for details).     41 year old female with left knee contusion, abrasion. She is educated on care. She is given crutches and knee immobilizer.  She'll rest ice and elevate. Naproxen as needed for pain. Follow-up with orthopedics if no improvement in 5-7 days.  Final Clinical Impressions(s) / ED Diagnoses   Final diagnoses:  Contusion of left knee, initial encounter  Abrasion, knee, left, initial encounter    New Prescriptions New Prescriptions   NAPROXEN (NAPROSYN) 500 MG TABLET    Take 1 tablet (500 mg total) by mouth 2 (two) times daily with a meal.     Evon Slack, PA-C 12/27/16 2354    Phineas Semen, MD 12/30/16 [phone]

## 2016-12-27 NOTE — ED Triage Notes (Signed)
Pt has left knee pain.  Pt states she tripped over the dog today and fell on gravel .  Pt has abrasion and pain in left knee.

## 2016-12-27 NOTE — Discharge Instructions (Signed)
Please rest ice and elevate the knee. Take naproxen as needed for pain. Use knee immobilizer as needed. Use crutches as needed for ambulation. Follow-up with orthopedics if no improvement in 5-7 days.

## 2016-12-28 NOTE — ED Notes (Signed)
Pt could not tolerate knee immobilizer and screamed to remove it when applied.  Placed an ace wrap on the knee then attempted the immobilizer, and she tolerated it much better.  Pt tearful and apologetic.  Pt had her own crutches, wheeled out to lobby by family.

## 2016-12-28 NOTE — ED Notes (Signed)
Pt already has crutches at bedside brought from home.

## 2017-07-08 ENCOUNTER — Emergency Department
Admission: EM | Admit: 2017-07-08 | Discharge: 2017-07-08 | Disposition: A | Discharge status: Home / Self Care | Payer: Self-pay | Attending: Emergency Medicine | Admitting: Emergency Medicine

## 2017-07-08 ENCOUNTER — Emergency Department: Payer: Self-pay

## 2017-07-08 ENCOUNTER — Encounter: Payer: Self-pay | Admitting: Emergency Medicine

## 2017-07-08 DIAGNOSIS — J449 Chronic obstructive pulmonary disease, unspecified: Secondary | ICD-10-CM | POA: Insufficient documentation

## 2017-07-08 DIAGNOSIS — Z79899 Other long term (current) drug therapy: Secondary | ICD-10-CM | POA: Insufficient documentation

## 2017-07-08 DIAGNOSIS — F17210 Nicotine dependence, cigarettes, uncomplicated: Secondary | ICD-10-CM | POA: Insufficient documentation

## 2017-07-08 HISTORY — DX: Age-related osteoporosis without current pathological fracture: M81.0

## 2017-07-08 MED ORDER — IPRATROPIUM-ALBUTEROL 0.5-2.5 (3) MG/3ML IN SOLN
3.0000 mL | Freq: Once | RESPIRATORY_TRACT | Status: AC
  Administered 2017-07-08: 3 mL via RESPIRATORY_TRACT
  Filled 2017-07-08: qty 3

## 2017-07-08 MED ORDER — ALBUTEROL SULFATE (2.5 MG/3ML) 0.083% IN NEBU: 2.5000 mg | INHALATION_SOLUTION | Freq: Four times a day (QID) | RESPIRATORY_TRACT | 12 refills | Status: DC | PRN

## 2017-07-08 MED ORDER — ALBUTEROL SULFATE HFA 108 (90 BASE) MCG/ACT IN AERS: 2.0000 | INHALATION_SPRAY | Freq: Four times a day (QID) | RESPIRATORY_TRACT | 2 refills | Status: DC | PRN

## 2017-07-08 MED ORDER — BENZONATATE 100 MG PO CAPS: 200.0000 mg | ORAL_CAPSULE | Freq: Three times a day (TID) | ORAL | 0 refills | Status: AC | PRN

## 2017-07-08 MED ORDER — PREDNISONE 10 MG PO TABS: ORAL_TABLET | ORAL | 0 refills | Status: DC

## 2017-07-08 MED ORDER — PREDNISONE 20 MG PO TABS
40.0000 mg | ORAL_TABLET | Freq: Once | ORAL | Status: AC
  Administered 2017-07-08: 40 mg via ORAL
  Filled 2017-07-08: qty 2

## 2017-07-08 NOTE — Discharge Instructions (Signed)
follow-up with your  primary care doctor or [facility] acute-care if any continued problems. Begin taking prednisone as directed and use inhaler 2 puffs 4 times a day. Discontinue smoking. Increase fluids. Tessalon as needed for coughing every 8 hours.

## 2017-07-08 NOTE — ED Provider Notes (Signed)
[facility] Emergency Department Provider Note   ____________________________________________   First MD Initiated Contact with Patient 07/08/17 1200     (approximate)  I have reviewed the triage vital signs and the nursing notes.   HISTORY  Chief Complaint Cough  HPI Sandra Reeves is a 41 y.o. female complaint of respiratory problems for more than one month. Patient states she's been going Care and then given antibiotics including Z-Pak, Levaquin, Omnicef and states that she is not any better. Patient denies any known fever or chills. She states that the cough is occasionally productive and keeps her awake at night. Patient continues to smoke one pack cigarettes per day and has done so since age 12.   Past Medical History:  Diagnosis Date  . Osteoporosis     There are no active problems to display for this patient.   Past Surgical History:  Procedure Laterality Date  . ABDOMINAL HYSTERECTOMY      Prior to Admission medications   Medication Sig Start Date End Date Taking? Authorizing Provider  albuterol (PROVENTIL HFA;VENTOLIN HFA) 108 (90 Base) MCG/ACT inhaler Inhale 2 puffs into the lungs every 6 (six) hours as needed for wheezing or shortness of breath. 07/08/17   Tommi Rumps, PA-C  albuterol (PROVENTIL) (2.5 MG/3ML) 0.083% nebulizer solution Take 3 mLs (2.5 mg total) by nebulization every 6 (six) hours as needed for wheezing or shortness of breath. 07/08/17   Tommi Rumps, PA-C  alendronate (FOSAMAX) 35 MG tablet Take 10 mg by mouth every 7 (seven) days. Take with a full glass of water on an empty stomach.    [provider]  benzonatate (TESSALON PERLES) 100 MG capsule Take 2 capsules (200 mg total) by mouth 3 (three) times daily as needed. 07/08/17 07/08/18  Tommi Rumps, PA-C  predniSONE (DELTASONE) 10 MG tablet Take 6 tablets  today, on day 2 take 5 tablets, day 3 take 4 tablets, day 4 take 3 tablets, day 5 take  2  tablets and 1 tablet the last day 07/08/17   Tommi Rumps, PA-C    Allergies Patient has no known allergies.  No family history on file.  Social History Social History  Substance Use Topics  . Smoking status: Heavy Tobacco Smoker    Packs/day: 1.00    Types: Cigarettes  . Smokeless tobacco: Never Used  . Alcohol use No    Review of Systems Constitutional: No fever/chills Eyes: No visual changes. ENT: No sore throat. Cardiovascular: Denies chest pain. Respiratory: Denies shortness of breath.  Positive nonproductive cough. Gastrointestinal: No abdominal pain.  No nausea, no vomiting.  Skin: Negative for rash. Neurological: Negative for headaches, focal weakness or numbness. ____________________________________________   PHYSICAL EXAM:  VITAL SIGNS: ED Triage Vitals  Enc Vitals Group     BP 07/08/17 1109 (!) 137/109     Pulse Rate 07/08/17 1109 76     Resp 07/08/17 1109 16     Temp 07/08/17 1109 97.8 F (36.6 C)     Temp Source 07/08/17 1109 Oral     SpO2 07/08/17 1109 100 %     Weight 07/08/17 1109 89 lb (40.4 kg)     Height 07/08/17 1109 5\' 1"  (1.549 m)     Head Circumference --      Peak Flow --      Pain Score 07/08/17 1108 7     Pain Loc --      Pain Edu? --  Excl. in GC? --    Constitutional: Alert and oriented. Well appearing and in no acute distress. Eyes: Conjunctivae are normal.  Head: Atraumatic. Nose: No congestion/rhinnorhea. Mouth/Throat: Mucous membranes are moist.  Oropharynx non-erythematous. Neck: No stridor.   Hematological/Lymphatic/Immunilogical: No cervical lymphadenopathy. Cardiovascular: Normal rate, regular rhythm. Grossly normal heart sounds.  Good peripheral circulation. Respiratory: Normal respiratory effort.  No retractions. Lungs coarse cough is noted. No wheezing. Mild bilateral rales, rhonchi was heard. Gastrointestinal: Soft and nontender. No distention. Neurologic:  Normal speech and language. No gross focal  neurologic deficits are appreciated.  Skin:  Skin is warm, dry and intact.  Psychiatric: Mood and affect are normal. Speech and behavior are normal.  ____________________________________________   LABS (all labs ordered are listed, but only abnormal results are displayed)  Labs Reviewed - No data to display  RADIOLOGY  Dg Chest 2 View  Result Date: 07/08/2017 CLINICAL DATA:  Chest pain, productive cough. EXAM: CHEST  2 VIEW COMPARISON:  Radiographs of May 26, 2009. FINDINGS: The heart size and mediastinal contours are within normal limits. Both lungs are clear. The visualized skeletal structures are unremarkable. IMPRESSION: No active cardiopulmonary disease. Electronically Signed   By: Lupita RaiderJames  Green Jr, M.D.   On: 07/08/2017 11:47    ____________________________________________   PROCEDURES  Procedure(s) performed: None  Procedures  Critical Care performed: No  ____________________________________________   INITIAL IMPRESSION / ASSESSMENT AND PLAN / ED COURSE  Patient encouraged to follow-up with [facility] acute[facility]care if any continued problems. She was discharged with an albuterol inhaler, Proventil nebulizer solution as she has access to a new machine and LawyerTessalon Perles. She is also given a prescription for prednisone to begin taking today. We discussed discontinuing smoking. 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   ALBUTEROL (PROVENTIL) (2.5 MG/3ML) 0.083% NEBULIZER SOLUTION    Take 3 mLs (2.5 mg total) by nebulization every 6 (six) hours as needed for wheezing or shortness  of breath.   BENZONATATE (TESSALON PERLES) 100 MG CAPSULE    Take 2 capsules (200 mg total) by mouth 3 (three) times daily as needed.   PREDNISONE (DELTASONE) 10 MG TABLET    Take 6 tablets  today, on day 2 take 5 tablets, day 3 take 4 tablets, day 4 take 3 tablets, day 5 take  2 tablets and 1 tablet the last day     Note:  This document was prepared using Dragon voice recognition software and may include unintentional dictation errors.    Tommi RumpsSummers, Rhonda L, PA-C 07/08/17 1630    Rockne MenghiniNorman, Anne-Caroline, MD 07/09/17 2258

## 2017-07-08 NOTE — ED Triage Notes (Signed)
Sick with resp problems for more than a month. Has been going [facility] and given antibiotics including z pack, levaquin and cefdinir.  Says still not better.  Says she feels nauseated.

## 2017-07-08 NOTE — ED Notes (Addendum)
Pt reports that she has been sick for 1 month - she was told she had bronchitis a few weeks ago and she has not improved - pt reports she is now vomiting (unable to count number of emesis events) - pt has productive cough with brown and yellow mucus - also has thick nasal secretions that are brown and yellow - c/o shortness of breath - c/o lower leg numbness and tingling - c/o chest and lower back pain

## 2018-11-28 ENCOUNTER — Other Ambulatory Visit: Payer: Self-pay | Admitting: Pediatrics

## 2018-11-28 DIAGNOSIS — J449 Chronic obstructive pulmonary disease, unspecified: Secondary | ICD-10-CM

## 2018-12-11 ENCOUNTER — Ambulatory Visit: Payer: Medicaid Other

## 2019-01-15 ENCOUNTER — Ambulatory Visit: Payer: Medicaid Other

## 2019-06-08 DIAGNOSIS — F909 Attention-deficit hyperactivity disorder, unspecified type: Secondary | ICD-10-CM | POA: Insufficient documentation

## 2021-05-22 ENCOUNTER — Emergency Department
Admission: EM | Admit: 2021-05-22 | Discharge: 2021-05-22 | Disposition: A | Discharge status: Home / Self Care | Payer: Medicaid Other | Attending: Emergency Medicine | Admitting: Emergency Medicine

## 2021-05-22 NOTE — ED Notes (Addendum)
BIB [facility] from work. Called out for SOB. Works at [employer]   LS clear. vitals 121/80 RR 24 p97% on RA. EMS asked pt about drug use and she denied any and was fine and stable during transport,.  Pt told ems she has HX of anxiety and panic attack.   Pt advised she needed to use the restroom. RN advised I would need a urine specimen,. Pt refused. Pt got up and walked out of room .RN tried to talk to pt and she kept walking., Pt hadnt even been registered yet.  No triage was complete pt walked out and RN was unable to locate pt. MD to room and he was informed of what had transpired.   Pt left AMA and no signatures obtained.

## 2021-05-23 ENCOUNTER — Other Ambulatory Visit
Admission: RE | Admit: 2021-05-23 | Discharge: 2021-05-23 | Disposition: A | Discharge status: Home / Self Care | Payer: 59 | Source: Ambulatory Visit | Attending: Cardiology | Admitting: Cardiology

## 2021-05-23 DIAGNOSIS — R0600 Dyspnea, unspecified: Secondary | ICD-10-CM | POA: Insufficient documentation

## 2021-05-23 DIAGNOSIS — J069 Acute upper respiratory infection, unspecified: Secondary | ICD-10-CM | POA: Diagnosis present

## 2021-05-23 DIAGNOSIS — Z23 Encounter for immunization: Secondary | ICD-10-CM | POA: Diagnosis present

## 2021-05-23 DIAGNOSIS — R0789 Other chest pain: Secondary | ICD-10-CM | POA: Insufficient documentation

## 2021-05-23 LAB — BRAIN NATRIURETIC PEPTIDE: B Natriuretic Peptide: 36.2 pg/mL (ref 0.0–100.0)

## 2021-05-24 ENCOUNTER — Emergency Department: Payer: 59

## 2021-05-24 ENCOUNTER — Emergency Department
Admission: EM | Admit: 2021-05-24 | Discharge: 2021-05-24 | Disposition: A | Discharge status: Home / Self Care | Payer: 59 | Attending: Emergency Medicine | Admitting: Emergency Medicine

## 2021-05-24 ENCOUNTER — Other Ambulatory Visit: Payer: Self-pay

## 2021-05-24 DIAGNOSIS — F17210 Nicotine dependence, cigarettes, uncomplicated: Secondary | ICD-10-CM | POA: Insufficient documentation

## 2021-05-24 DIAGNOSIS — R0602 Shortness of breath: Secondary | ICD-10-CM | POA: Insufficient documentation

## 2021-05-24 DIAGNOSIS — R1032 Left lower quadrant pain: Secondary | ICD-10-CM

## 2021-05-24 DIAGNOSIS — E860 Dehydration: Secondary | ICD-10-CM | POA: Insufficient documentation

## 2021-05-24 DIAGNOSIS — Z20822 Contact with and (suspected) exposure to covid-19: Secondary | ICD-10-CM | POA: Insufficient documentation

## 2021-05-24 DIAGNOSIS — M549 Dorsalgia, unspecified: Secondary | ICD-10-CM | POA: Diagnosis not present

## 2021-05-24 DIAGNOSIS — R42 Dizziness and giddiness: Secondary | ICD-10-CM | POA: Diagnosis not present

## 2021-05-24 DIAGNOSIS — J449 Chronic obstructive pulmonary disease, unspecified: Secondary | ICD-10-CM | POA: Diagnosis not present

## 2021-05-24 DIAGNOSIS — R519 Headache, unspecified: Secondary | ICD-10-CM | POA: Diagnosis not present

## 2021-05-24 DIAGNOSIS — R202 Paresthesia of skin: Secondary | ICD-10-CM | POA: Diagnosis not present

## 2021-05-24 DIAGNOSIS — R079 Chest pain, unspecified: Secondary | ICD-10-CM | POA: Diagnosis not present

## 2021-05-24 DIAGNOSIS — M791 Myalgia, unspecified site: Secondary | ICD-10-CM | POA: Diagnosis not present

## 2021-05-24 LAB — URINALYSIS, COMPLETE (UACMP) WITH MICROSCOPIC
Bacteria, UA: NONE SEEN
Bilirubin Urine: NEGATIVE
Glucose, UA: NEGATIVE mg/dL
Hgb urine dipstick: NEGATIVE
Ketones, ur: 15 mg/dL — AB
Leukocytes,Ua: NEGATIVE
Nitrite: NEGATIVE
Protein, ur: NEGATIVE mg/dL
Specific Gravity, Urine: 1.015 (ref 1.005–1.030)
pH: 8.5 — ABNORMAL HIGH (ref 5.0–8.0)

## 2021-05-24 LAB — CBC
HCT: 38.6 % (ref 36.0–46.0)
Hemoglobin: 13.6 g/dL (ref 12.0–15.0)
MCH: 31.5 pg (ref 26.0–34.0)
MCHC: 35.2 g/dL (ref 30.0–36.0)
MCV: 89.4 fL (ref 80.0–100.0)
Platelets: 321 10*3/uL (ref 150–400)
RBC: 4.32 MIL/uL (ref 3.87–5.11)
RDW: 12 % (ref 11.5–15.5)
WBC: 9.8 10*3/uL (ref 4.0–10.5)
nRBC: 0 % (ref 0.0–0.2)

## 2021-05-24 LAB — HEPATIC FUNCTION PANEL
ALT: 14 U/L (ref 0–44)
AST: 21 U/L (ref 15–41)
Albumin: 4 g/dL (ref 3.5–5.0)
Alkaline Phosphatase: 71 U/L (ref 38–126)
Bilirubin, Direct: <0.1 mg/dL (ref 0.0–0.2)
Total Bilirubin: 0.6 mg/dL (ref 0.3–1.2)
Total Protein: 6.9 g/dL (ref 6.5–8.1)

## 2021-05-24 LAB — RESP PANEL BY RT-PCR (FLU A&B, COVID) ARPGX2
Influenza A by PCR: NEGATIVE
Influenza B by PCR: NEGATIVE
SARS Coronavirus 2 by RT PCR: NEGATIVE

## 2021-05-24 LAB — BASIC METABOLIC PANEL
Anion gap: 8 (ref 5–15)
BUN: 16 mg/dL (ref 6–20)
CO2: 22 mmol/L (ref 22–32)
Calcium: 9.3 mg/dL (ref 8.9–10.3)
Chloride: 109 mmol/L (ref 98–111)
Creatinine, Ser: 0.83 mg/dL (ref 0.44–1.00)
GFR, Estimated: >60 mL/min (ref >60)
Glucose, Bld: 98 mg/dL (ref 70–99)
Potassium: 4.2 mmol/L (ref 3.5–5.1)
Sodium: 139 mmol/L (ref 135–145)

## 2021-05-24 LAB — TROPONIN I (HIGH SENSITIVITY)
Troponin I (High Sensitivity): 2 ng/L (ref <18)
Troponin I (High Sensitivity): 3 ng/L (ref <18)

## 2021-05-24 MED ORDER — ACETAMINOPHEN 500 MG PO TABS
1000.0000 mg | ORAL_TABLET | Freq: Once | ORAL | Status: AC
  Administered 2021-05-24: 1000 mg via ORAL
  Filled 2021-05-24: qty 2

## 2021-05-24 MED ORDER — LACTATED RINGERS IV BOLUS
1000.0000 mL | Freq: Once | INTRAVENOUS | Status: AC
  Administered 2021-05-24: 1000 mL via INTRAVENOUS

## 2021-05-24 MED ORDER — IOHEXOL 350 MG/ML SOLN
50.0000 mL | Freq: Once | INTRAVENOUS | Status: AC | PRN
  Administered 2021-05-24: 50 mL via INTRAVENOUS

## 2021-05-24 NOTE — ED Triage Notes (Signed)
Pt to ED for generalized cp and shob for a few days, states seen at [facility] cardio yesterday. +nausea. NAD noted

## 2021-05-24 NOTE — ED Provider Notes (Signed)
[facility] Emergency Department Provider Note  ____________________________________________   Event Date/Time   First MD Initiated Contact with Patient 05/24/21 1145     (approximate)  I have reviewed the triage vital signs and the nursing notes.   HISTORY  Chief Complaint Chest Pain and Shortness of Breath   HPI Sandra Reeves is a 45 y.o. female with a past medical history of COPD, tobacco abuse, ADHD, Lyme disease and recent outpatient cardiology evaluation to establish care who presents for assessment of couple concerns including shortness of breath, chest pressure, headache, left-sided abdominal and flank pain rating around the left lower quadrant of the back as well as some pain in her left calf.  Patient states that she has been feeling dizzy and lightheaded and about to pass out for the last 2 days.  She states she has intermittent numbness and tingling in her arms and both hands.  She states EMS was called yesterday although she kept having spasms with a BP cuff and she left the ED AMA when she was initially brought to the emergency department.  Patient states he does not remember exactly what happened.  She states that today her shortness of breath dizziness and intermittent tingling in her arms are much worse.  He denies any vision changes, change in her chest discomfort, significant increasing cough from baseline, vomiting, diarrhea, dysuria, rash or recent injuries or falls.  Denies any recent medication changes.  She states he has inhalers she uses at home for her breathing but these have not significantly helped.  She denies any other acute concerns at this time.         Past Medical History:  Diagnosis Date   Osteoporosis     There are no problems to display for this patient.   Past Surgical History:  Procedure Laterality Date   ABDOMINAL HYSTERECTOMY      Prior to Admission medications   Medication Sig Start Date End Date Taking?  Authorizing Provider  albuterol (PROVENTIL HFA;VENTOLIN HFA) 108 (90 Base) MCG/ACT inhaler Inhale 2 puffs into the lungs every 6 (six) hours as needed for wheezing or shortness of breath. 07/08/17   Tommi Rumps, PA-C  albuterol (PROVENTIL) (2.5 MG/3ML) 0.083% nebulizer solution Take 3 mLs (2.5 mg total) by nebulization every 6 (six) hours as needed for wheezing or shortness of breath. 07/08/17   Tommi Rumps, PA-C  alendronate (FOSAMAX) 35 MG tablet Take 10 mg by mouth every 7 (seven) days. Take with a full glass of water on an empty stomach.    [provider]  predniSONE (DELTASONE) 10 MG tablet Take 6 tablets  today, on day 2 take 5 tablets, day 3 take 4 tablets, day 4 take 3 tablets, day 5 take  2 tablets and 1 tablet the last day 07/08/17   Tommi Rumps, PA-C    Allergies Patient has no known allergies.  No family history on file.  Social History Social History   Tobacco Use   Smoking status: Heavy Smoker    Packs/day: 1.00    Types: Cigarettes   Smokeless tobacco: Never  Substance Use Topics   Alcohol use: No    Review of Systems  Review of Systems  Constitutional:  Positive for malaise/fatigue. Negative for chills and fever.  HENT:  Negative for sore throat.   Eyes:  Negative for pain.  Respiratory:  Positive for shortness of breath. Negative for cough and stridor.   Cardiovascular:  Positive for chest pain.  Gastrointestinal:  Positive for abdominal pain. Negative for vomiting.  Genitourinary:  Positive for flank pain. Negative for dysuria.  Musculoskeletal:  Positive for back pain. Negative for myalgias.  Skin:  Negative for rash.  Neurological:  Positive for dizziness, tingling, sensory change, weakness and headaches. Negative for seizures and loss of consciousness.  Psychiatric/Behavioral:  Negative for suicidal ideas. The patient is nervous/anxious.   All other systems reviewed and are negative.     ____________________________________________   PHYSICAL EXAM:  VITAL SIGNS: ED Triage Vitals  Enc Vitals Group     BP 05/24/21 1138 134/86     Pulse Rate 05/24/21 1138 69     Resp 05/24/21 1138 16     Temp 05/24/21 1138 97.9 F (36.6 C)     Temp Source 05/24/21 1138 Oral     SpO2 05/24/21 1138 100 %     Weight 05/24/21 1139 97 lb (44 kg)     Height 05/24/21 1139 5' (1.524 m)     Head Circumference --      Peak Flow --      Pain Score 05/24/21 1139 4     Pain Loc --      Pain Edu? --      Excl. in GC? --    Vitals:   05/24/21 1400 05/24/21 1430  BP: 123/84 (!) 141/82  Pulse: 62 (!) 43  Resp: 14 11  Temp:    SpO2: 99% 100%   Physical Exam Vitals and nursing note reviewed.  Constitutional:      General: She is not in acute distress.    Appearance: She is well-developed.  HENT:     Head: Normocephalic and atraumatic.     Right Ear: External ear normal.     Left Ear: External ear normal.     Nose: Nose normal.     Mouth/Throat:     Mouth: Mucous membranes are dry.  Eyes:     Conjunctiva/sclera: Conjunctivae normal.  Cardiovascular:     Rate and Rhythm: Normal rate and regular rhythm.     Heart sounds: No murmur heard. Pulmonary:     Effort: Pulmonary effort is normal. No respiratory distress.     Breath sounds: Normal breath sounds.  Abdominal:     Palpations: Abdomen is soft.     Tenderness: There is no abdominal tenderness.  Musculoskeletal:     Cervical back: Neck supple.  Skin:    General: Skin is warm and dry.     Capillary Refill: Capillary refill takes more than 3 seconds.  Neurological:     Mental Status: She is alert and oriented to person, place, and time.  Psychiatric:        Mood and Affect: Mood normal.    Cranial nerves II through XII grossly intact.  No pronator drift.  No finger dysmetria.  Symmetric 5/5 strength of all extremities.  Sensation intact to light touch in all extremities.  Unremarkable unassisted  gait.  ____________________________________________   LABS (all labs ordered are listed, but only abnormal results are displayed)  Labs Reviewed  URINALYSIS, COMPLETE (UACMP) WITH MICROSCOPIC - Abnormal; Notable for the following components:      Result Value   pH 8.5 (*)    Ketones, ur 15 (*)    All other components within normal limits  RESP PANEL BY RT-PCR (FLU A&B, COVID) ARPGX2  BASIC METABOLIC PANEL  CBC  HEPATIC FUNCTION PANEL  TROPONIN I (HIGH SENSITIVITY)  TROPONIN I (HIGH SENSITIVITY)   ____________________________________________  EKG  Sinus rhythm with a ventricular rate of 78, unremarkable intervals, normal axis without evidence of acute ischemia or significant arrhythmia. ____________________________________________  RADIOLOGY  ED MD interpretation: CT head is unremarkable for evidence of intracranial hemorrhage, hydrocephalus, mass-effect or other acute process.  Chest x-ray shows some hyperinflation without focal consolidation, effusion, edema, pneumothorax or other acute thoracic process.  CT abdomen pelvis shows no evidence of diverticulitis, kidney stone, pyelonephritis, appendicitis or other acute abdominal pelvic process.  Ultrasound of the upper extremity shows no evidence of DVT or other acute abnormality.  Official radiology report(s): DG Chest 2 View  Result Date: 05/24/2021 CLINICAL DATA:  Generalized chest pain and shortness of breath for few days with nausea. EXAM: CHEST - 2 VIEW COMPARISON:  07/08/2017. FINDINGS: Trachea is midline. Heart size normal. Lungs are hyperinflated. Biapical pleural thickening. No airspace consolidation or pleural fluid. IMPRESSION: Hyperinflation without acute finding. Electronically Signed   By: Leanna Battles M.D.   On: 05/24/2021 13:09   CT HEAD WO CONTRAST ( )  Result Date: 05/24/2021 CLINICAL DATA:  Headache, intracranial hemorrhage suspected EXAM: CT HEAD WITHOUT CONTRAST TECHNIQUE: Contiguous axial images  were obtained from the base of the skull through the vertex without intravenous contrast. COMPARISON:  12/27/2014 FINDINGS: Brain: No evidence of acute infarction, hemorrhage, hydrocephalus, extra-axial collection or mass lesion/mass effect. Vascular: No hyperdense vessel or unexpected calcification. Skull: Normal. Negative for fracture or focal lesion. Sinuses/Orbits: No acute finding. Other: None. IMPRESSION: No acute intracranial findings. Electronically Signed   By: Duanne Guess D.O.   On: 05/24/2021 13:27   CT ABDOMEN PELVIS W CONTRAST  Result Date: 05/24/2021 CLINICAL DATA:  Abdominal pain and nausea. Chest pain and shortness of breath. EXAM: CT ABDOMEN AND PELVIS WITH CONTRAST TECHNIQUE: Multidetector CT imaging of the abdomen and pelvis was performed using the standard protocol following bolus administration of intravenous contrast. CONTRAST:  48mL OMNIPAQUE IOHEXOL 350 MG/ML SOLN COMPARISON:  None. FINDINGS: Lower Chest: No acute findings. Hepatobiliary: A few tiny sub-cm low-attenuation liver lesions are noted which are too small to characterize, but most likely represent tiny benign cysts. No hepatic masses identified. Gallbladder is unremarkable. No evidence of biliary ductal dilatation. Pancreas:  No mass or inflammatory changes. Spleen: Within normal limits in size and appearance. Adrenals/Urinary Tract: No masses identified. Small benign right renal cyst noted. No evidence of ureteral calculi or hydronephrosis. Unremarkable unopacified urinary bladder. Stomach/Bowel: No evidence of obstruction, inflammatory process or abnormal fluid collections. Normal appendix visualized. Vascular/Lymphatic: No pathologically enlarged lymph nodes. No acute vascular findings. Aortic atherosclerotic calcification noted. Reproductive: Prior hysterectomy noted. Adnexal regions are unremarkable in appearance. Other:  None. Musculoskeletal:  No suspicious bone lesions identified. IMPRESSION: No acute findings or  other significant within the abdomen or pelvis. Aortic Atherosclerosis (ICD10-I70.0). Electronically Signed   By: Danae Orleans M.D.   On: 05/24/2021 14:01   US Venous Img Lower Unilateral Left  Result Date: 05/24/2021 CLINICAL DATA:  LEFT leg pain x1 day. EXAM: LEFT LOWER EXTREMITY VENOUS DOPPLER ULTRASOUND TECHNIQUE: Gray-scale sonography with compression, as well as color and duplex ultrasound, were performed to evaluate the deep venous system(s) from the level of the common femoral vein through the popliteal and proximal calf veins. COMPARISON:  LEFT knee radiographs, 01/02/2017. FINDINGS: VENOUS Normal compressibility of the common femoral, superficial femoral, and popliteal veins, as well as the visualized calf veins. Visualized portions of profunda femoral vein and great saphenous vein unremarkable. No filling defects to suggest DVT on grayscale or color Doppler imaging. Doppler  waveforms show normal direction of venous flow, normal respiratory plasticity and response to augmentation. Limited views of the contralateral common femoral vein are unremarkable. OTHER No evidence of superficial thrombophlebitis or abnormal fluid collection. Limitations: none IMPRESSION: No evidence of femoropopliteal DVT within the LEFT lower extremity. Roanna BanningJon Mugweru, MD [facility] Electronically Signed   By: Roanna BanningJon  Mugweru M.D.   On: 05/24/2021 14:31    ____________________________________________   PROCEDURES  Procedure(s) performed (including Critical Care):  .1-3 Lead EKG Interpretation  Date/Time: 05/24/2021 3:50 PM Performed by: Gilles ChiquitoSmith, Amika Tassin P, MD Authorized by: Gilles ChiquitoSmith, Georgette Helmer P, MD     Interpretation: non-specific     ECG rate assessment: bradycardic     Rhythm: sinus bradycardia     Ectopy: none     Conduction: normal     ____________________________________________   INITIAL IMPRESSION / ASSESSMENT AND PLAN / ED COURSE      Patient  presents with above to history exam for assessment of multiple complaints.  On arrival she is afebrile and hemodynamically stable her abdomen is soft lungs are clear bilaterally.  She has a nonfocal supine neuro exam.  With regard to headache differential includes migraine, metabolic derangements, dehydration versus primary headache.  CT head shows no evidence of SAH or ventriculomegaly or other acute intracranial process.  No findings on exam or history to suggest deep space infection in the head or neck or trauma. CT head is unremarkable for evidence of intracranial hemorrhage, hydrocephalus, mass-effect or other acute process.  After some IV fluids and Tylenol she states her headache is much better.  With regard to her chest pain shortness of breath differential includes ACS, PE, pneumonia, bronchitis, anemia, pericarditis, myocarditis and pleurisy.  Overall I have low suspicion for PE as patient is PERC negative.  She has no wheezing to suggest acute COPD exacerbation.  She is not hypoxic or tachypneic.  Chest x-ray shows some hyperinflation without focal consolidation, effusion, edema, pneumothorax or other acute thoracic process.  ECG and nonelevated troponin x2 are not suggestive of ACS or myocarditis.  I PR shows no significant electrolyte or metabolic derangements and CBC is unremarkable.  Unclear at this time although given stable vitals with patient stating she is feeling much better after some IV fluids have low suspicion for immediate life-threatening thoracic process.  With regard to to her abdominal pain left flank pain and lower abdominal pain differential includes cystitis, kidney stone and diverticulitis.  No evidence on exam of cellulitis or shingles.  CT abdomen pelvis shows no evidence of diverticulitis, kidney stone, pyelonephritis, appendicitis or other acute abdominal pelvic process.  Hepatic function panel has no evidence of hepatitis or cholestasis.  UA without evidence of  infection.  Concern for possible muscle spasm versus cramp.  Patient states she was feeling much better with some IV fluids and Tylenol.  Ultrasound of the lower extremity shows no evidence of DVT or other acute process.  Suspect possible cramps from dehydration.  No evidence of cellulitis or trauma.   Suspect patient's consolation of symptoms is related to some dehydration.  She was hydrated with IV fluids.  She is not orthostatic.  She is tolerating p.o.  Given stable vitals with eyes reassuring work-up I think she is stable for discharge with close outpatient follow-up.Marland Kitchen.  Discharged stable condition.  Strict return precautions advised and discussed.     ____________________________________________   FINAL CLINICAL IMPRESSION(S) / ED DIAGNOSES  Final diagnoses:  Dehydration  Nonintractable headache, unspecified chronicity pattern,  unspecified headache type  Chest pain, unspecified type  Left lower quadrant abdominal pain  Acute left-sided back pain, unspecified back location  Myalgia    Medications  lactated ringers bolus 1,000 mL (0 mLs Intravenous Stopped 05/24/21 1454)  acetaminophen (TYLENOL) tablet 1,000 mg (1,000 mg Oral Given 05/24/21 1221)  iohexol (OMNIPAQUE) 350 MG/ML injection 50 mL (50 mLs Intravenous Contrast Given 05/24/21 1242)     ED Discharge Orders     None        Note:  This document was prepared using Dragon voice recognition software and may include unintentional dictation errors.    Gilles Chiquito, MD 05/24/21 [phone]

## 2021-06-04 DIAGNOSIS — J449 Chronic obstructive pulmonary disease, unspecified: Secondary | ICD-10-CM | POA: Insufficient documentation

## 2021-07-24 ENCOUNTER — Ambulatory Visit: Payer: 59 | Admitting: Nurse Practitioner

## 2021-08-14 ENCOUNTER — Other Ambulatory Visit (INDEPENDENT_AMBULATORY_CARE_PROVIDER_SITE_OTHER): Payer: Self-pay | Admitting: Nurse Practitioner

## 2021-08-14 DIAGNOSIS — I83813 Varicose veins of bilateral lower extremities with pain: Secondary | ICD-10-CM

## 2021-08-15 ENCOUNTER — Other Ambulatory Visit: Payer: Self-pay

## 2021-08-15 ENCOUNTER — Ambulatory Visit (INDEPENDENT_AMBULATORY_CARE_PROVIDER_SITE_OTHER): Payer: 59

## 2021-08-15 ENCOUNTER — Ambulatory Visit (INDEPENDENT_AMBULATORY_CARE_PROVIDER_SITE_OTHER): Payer: 59 | Admitting: Nurse Practitioner

## 2021-08-15 ENCOUNTER — Encounter (INDEPENDENT_AMBULATORY_CARE_PROVIDER_SITE_OTHER): Payer: Self-pay | Admitting: Nurse Practitioner

## 2021-08-15 VITALS — BP 119/77 | HR 57 | Ht 60.0 in | Wt 103.0 lb

## 2021-08-15 DIAGNOSIS — I83813 Varicose veins of bilateral lower extremities with pain: Secondary | ICD-10-CM

## 2021-08-15 DIAGNOSIS — M79605 Pain in left leg: Secondary | ICD-10-CM

## 2021-08-15 DIAGNOSIS — M545 Low back pain, unspecified: Secondary | ICD-10-CM

## 2021-08-15 DIAGNOSIS — G8929 Other chronic pain: Secondary | ICD-10-CM | POA: Diagnosis not present

## 2021-08-15 DIAGNOSIS — M79604 Pain in right leg: Secondary | ICD-10-CM

## 2021-08-20 ENCOUNTER — Encounter (INDEPENDENT_AMBULATORY_CARE_PROVIDER_SITE_OTHER): Payer: Self-pay | Admitting: Nurse Practitioner

## 2021-08-20 NOTE — Progress Notes (Signed)
Subjective:    Patient ID: Sandra Reeves, female    DOB: Nov 24, 1975, 45 y.o.   MRN: [id number] Chief Complaint  Patient presents with   New Patient (Initial Visit)    NP reflux_ consult BLE VV with pain referred by [facility]     Sandra Reeves is a 45 year old female that presents today for evaluation of her lower extremities due to pain that is concerning for possible varicose vein issues.  The patient notes that she walks between 21-31,000 steps daily.  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  She notes that she has extensive burning in her lower extremities and the pain is worse when she lays in the bed.  She notes that they feel as if they are on fire.  Recently she has been Having pain in her groin that radiates down her leg.  She also notes that she has back pain as well.  She was concerned about her varicose veins because she notes that they become prominent as the day wears on.  She has some knotty areas although there not significantly swelling.  Today noninvasive studies show no evidence of DVT or superficial thrombophlebitis bilaterally.  No evidence of deep venous insufficiency or superficial venous reflux seen bilaterally.   Review of Systems  Musculoskeletal:  Positive for back pain, gait problem and myalgias.  All other systems reviewed and are negative.     Objective:   Physical Exam Vitals reviewed.  HENT:     Head: Normocephalic.  Cardiovascular:     Rate and Rhythm: Normal rate.     Pulses:          Dorsalis pedis pulses are 2+ on the right side and 2+ on the left side.       Posterior tibial pulses are 2+ on the right side and 2+ on the left side.  Pulmonary:     Effort: Pulmonary effort is normal.  Skin:    General: Skin is warm and dry.  Neurological:     Mental Status: She is alert and oriented to person, place, and time.  Psychiatric:        Mood and Affect: Mood normal.        Behavior: Behavior normal.        Thought Content: Thought content  normal.        Judgment: Judgment normal.    BP 119/77   Pulse (!) 57   Ht 5' (1.524 m)   Wt 103 lb (46.7 kg)   BMI 20.12 kg/m   Past Medical History:  Diagnosis Date   Osteoporosis     Social History   Socioeconomic History   Marital status: Single    Spouse name: Not on file   Number of children: Not on file   Years of education: Not on file   Highest education level: Not on file  Occupational History   Not on file  Tobacco Use   Smoking status: Heavy Smoker    Packs/day: 1.00    Types: Cigarettes   Smokeless tobacco: Never  Substance and Sexual Activity   Alcohol use: No   Drug use: Not on file   Sexual activity: Not on file  Other Topics Concern   Not on file  Social History Narrative   Not on file   Social Determinants of Health   Financial Resource Strain: Not on file  Food Insecurity: Not on file  Transportation Needs: Not on file  Physical Activity: Not on file  Stress:  Not on file  Social Connections: Not on file  Intimate Partner Violence: Not on file    Past Surgical History:  Procedure Laterality Date   ABDOMINAL HYSTERECTOMY      History reviewed. No pertinent family history.  Allergies  Allergen Reactions   Duloxetine Other (See Comments)    Dehydration, muscle contraction in hands, vomit Other reaction(s): Unknown Dehydration, muscle contraction in hands, vomit     CBC Latest Ref Rng & Units 05/24/2021 10/21/2014 02/24/2013  WBC 4.0 - 10.5 K/uL 9.8 10.1 10.2  Hemoglobin 12.0 - 15.0 g/dL 73.7 10.6 26.9  Hematocrit 36.0 - 46.0 % 38.6 40.2 41.5  Platelets 150 - 400 K/uL 321 275 280      CMP     Component Value Date/Time   NA 139 05/24/2021 1142   NA 139 10/21/2014 0824   K 4.2 05/24/2021 1142   K 3.5 10/21/2014 0824   CL 109 05/24/2021 1142   CL 107 10/21/2014 0824   CO2 22 05/24/2021 1142   CO2 24 10/21/2014 0824   GLUCOSE 98 05/24/2021 1142   GLUCOSE 142 (H) 10/21/2014 0824   BUN 16 05/24/2021 1142   BUN 13 10/21/2014  0824   CREATININE 0.83 05/24/2021 1142   CREATININE 0.88 10/21/2014 0824   CALCIUM 9.3 05/24/2021 1142   CALCIUM 8.9 10/21/2014 0824   PROT 6.9 05/24/2021 1142   PROT 7.3 10/21/2014 0824   ALBUMIN 4.0 05/24/2021 1142   ALBUMIN 4.2 10/21/2014 0824   AST 21 05/24/2021 1142   AST 17 10/21/2014 0824   ALT 14 05/24/2021 1142   ALT 16 10/21/2014 0824   ALKPHOS 71 05/24/2021 1142   ALKPHOS 101 10/21/2014 0824   BILITOT 0.6 05/24/2021 1142   BILITOT 0.5 10/21/2014 0824   GFRNONAA >60 05/24/2021 1142   GFRNONAA >60 10/21/2014 0824   GFRNONAA >60 02/24/2013 1019   GFRAA >60 10/21/2014 0824   GFRAA >60 02/24/2013 1019     No results found.     Assessment & Plan:   1. Pain in both lower extremities Given that the patient has her legs in dependent positions for extended amount of time throughout the day the prominence of the veins given her small body habitus is not unusual.  The knots that she describes in her legs are likely valves that are little bit more prominent.  Today noninvasive studies show no evidence of venous reflux or evidence of DVT or superficial thrombophlebitis.  She has strong palpable pulses bilaterally.  Due to no abnormalities of the venous system, intervention is not indicated at this time.    Based on her description of pain I suspect it results from either her lower back or it is possibly neurological in cause such as neuropathy.  I have suggested that the patient follow-up with her primary care provider for further work-up and evaluation.  She will follow-up with [place] on an as-needed basis.  2. Chronic bilateral low back pain without sciatica This may also play a part in the patient's lower extremity pain.   Current Outpatient Medications on File Prior to Visit  Medication Sig Dispense Refill   albuterol (PROVENTIL HFA;VENTOLIN HFA) 108 (90 Base) MCG/ACT inhaler Inhale 2 puffs into the lungs every 6 (six) hours as needed for wheezing or shortness of breath.  (Patient not taking: Reported on 08/15/2021) 1 Inhaler 2   albuterol (PROVENTIL) (2.5 MG/3ML) 0.083% nebulizer solution Take 3 mLs (2.5 mg total) by nebulization every 6 (six) hours as needed for wheezing or  shortness of breath. (Patient not taking: Reported on 08/15/2021) 75 mL 12   alendronate (FOSAMAX) 35 MG tablet Take 10 mg by mouth every 7 (seven) days. Take with a full glass of water on an empty stomach. (Patient not taking: Reported on 08/15/2021)     predniSONE (DELTASONE) 10 MG tablet Take 6 tablets  today, on day 2 take 5 tablets, day 3 take 4 tablets, day 4 take 3 tablets, day 5 take  2 tablets and 1 tablet the last day (Patient not taking: Reported on 08/15/2021) 21 tablet 0   No current facility-administered medications on file prior to visit.    There are no Patient Instructions on file for this visit. No follow-ups on file.   Kris Hartmann, NP

## 2021-09-25 ENCOUNTER — Ambulatory Visit: Payer: Medicaid Other | Admitting: Nurse Practitioner

## 2021-10-31 ENCOUNTER — Ambulatory Visit: Payer: Self-pay | Admitting: Physician Assistant

## 2021-10-31 ENCOUNTER — Ambulatory Visit (INDEPENDENT_AMBULATORY_CARE_PROVIDER_SITE_OTHER): Payer: Self-pay

## 2021-10-31 ENCOUNTER — Other Ambulatory Visit: Payer: Self-pay

## 2021-10-31 DIAGNOSIS — M542 Cervicalgia: Secondary | ICD-10-CM

## 2021-10-31 DIAGNOSIS — M545 Low back pain, unspecified: Secondary | ICD-10-CM

## 2021-10-31 MED ORDER — METHYLPREDNISOLONE 4 MG PO TBPK: ORAL_TABLET | ORAL | 0 refills | Status: DC

## 2021-10-31 MED ORDER — METHOCARBAMOL 500 MG PO TABS: 500.0000 mg | ORAL_TABLET | Freq: Four times a day (QID) | ORAL | 0 refills | Status: DC | PRN

## 2021-10-31 NOTE — Progress Notes (Signed)
Office Visit Note   Patient: Sandra Reeves           Date of Birth: [birth year]           MRN: [id number] Visit Date: 10/31/2021              Requested by: No referring provider defined for this encounter. PCP: Patient, No Pcp Per (Inactive)  Chief Complaint  Patient presents with   Neck - Pain   Lower Back - Pain      HPI: Patient is a pleasant 46 year old woman who presents today complaining of neck pain and stiffness lower back pain and stiffness and soreness over the muscles of her mid back.  She denies any injuries however does work a job that demands that she is on her feet and walking for what she says 11 to 12 hours a shift.  She has had some problems with her back in the past.  She denies any acute injury she denies any loss of bowel or bladder control any leg weakness  Assessment & Plan: Visit Diagnoses:  1. Neck pain   2. Acute bilateral low back pain, unspecified whether sciatica present     Plan: Lower back pain with mild sciatic symptoms.  Also neck pain and stiffness.  I have recommended a Medrol Dosepak which she has taken in the past and tolerated.  I will also give her a muscle relaxant if needed.  She understands not to take this in the evenings.  I would recommend physical therapy however she does not have insurance.  I did provide her with spine stabilization exercises.  We will reevaluate her in 3 weeks.  Follow-Up Instructions: No follow-ups on file.   Ortho Exam  Patient is alert, oriented, no adenopathy, well-dressed, normal affect, normal respiratory effort. Examination of her cervical spine she just has global tenderness over the paravertebral muscles in the neck no step-off or abnormality noted at the spine either in the cervical or lower back region.  She does have quite a bit of tenderness especially on the left side beneath the scapula.  No scapular instability.  She has good upper body strength 5 out of 5 with grip strength resisted abduction  abduction.  Sensation is intact.  Lower extremities.  Negative straight leg raise bilaterally good range of motion that is painless of both hips.  She has 5 out of 5 strength with resisted extension of her legs dorsiflexion plantarflexion and flexion of her hips.  She has deep tendon reflexes are 2+ and equivalent  Imaging: XR Cervical Spine 2 or 3 views  Result Date: 10/31/2021 Cervical spine films were taken today.  No acute fracture she does have maintained and preserved spacing between the vertebral bodies with some very early degenerative changes.  She does have loss of the normal lordotic curve  XR Lumbar Spine 2-3 Views  Result Date: 10/31/2021 Review of her lumbar back x-rays were done today.  Overall well-maintained alignment.  No loss of vertebral height.  Mild degenerative changes no acute fractures  No images are attached to the encounter.  Labs: Lab Results  Component Value Date   ESRSEDRATE 2 01/03/2012     Lab Results  Component Value Date   ALBUMIN 4.0 05/24/2021   ALBUMIN 4.2 10/21/2014   ALBUMIN 4.4 02/24/2013    No results found for: MG No results found for: VD25OH  No results found for: PREALBUMIN CBC EXTENDED Latest Ref Rng & Units 05/24/2021 10/21/2014 02/24/2013  WBC 4.0 -  10.5 K/uL 9.8 10.1 10.2  RBC 3.87 - 5.11 MIL/uL 4.32 4.37 4.65  HGB 12.0 - 15.0 g/dL 09.9 83.3 82.5  HCT 05.3 - 46.0 % 38.6 40.2 41.5  PLT 150 - 400 K/uL 321 275 280     There is no height or weight on file to calculate BMI.  Orders:  Orders Placed This Encounter  Procedures   XR Cervical Spine 2 or 3 views   XR Lumbar Spine 2-3 Views   Meds ordered this encounter  Medications   methylPREDNISolone (MEDROL DOSEPAK) 4 MG TBPK tablet    Sig: Take as directed on package    Dispense:  21 tablet    Refill:  0   methocarbamol (ROBAXIN) 500 MG tablet    Sig: Take 1 tablet (500 mg total) by mouth every 6 (six) hours as needed for muscle spasms.    Dispense:  30 tablet    Refill:  0      Procedures: No procedures performed  Clinical Data: No additional findings.  ROS:  All other systems negative, except as noted in the HPI. Review of Systems  Objective: Vital Signs: There were no vitals taken for this visit.  Specialty Comments:  No specialty comments available.  PMFS History: Patient Active Problem List   Diagnosis Date Noted   Chronic obstructive pulmonary disease (HCC) 06/04/2021   ADHD (attention deficit hyperactivity disorder) 06/08/2019   Myofascial pain dysfunction syndrome 06/20/2016   Spondylosis of lumbosacral region without myelopathy or radiculopathy 06/20/2016   Chronic bilateral low back pain without sciatica 03/26/2016   H/O osteoporosis 12/27/2015   Concussion syndrome 11/30/2014   Other accident caused by striking against or being struck accidentally by objects or Roberto Hlavaty with or without subsequent fall 11/30/2014   Back pain 11/28/2011   Past Medical History:  Diagnosis Date   Osteoporosis     No family history on file.  Past Surgical History:  Procedure Laterality Date   ABDOMINAL HYSTERECTOMY     Social History   Occupational History   Not on file  Tobacco Use   Smoking status: Heavy Smoker    Packs/day: 1.00    Types: Cigarettes   Smokeless tobacco: Never  Substance and Sexual Activity   Alcohol use: No   Drug use: Not on file   Sexual activity: Not on file

## 2021-11-13 ENCOUNTER — Telehealth: Payer: Self-pay | Admitting: Physician Assistant

## 2021-11-13 NOTE — Telephone Encounter (Signed)
Patient called asked if she can get set up for an MRI on her legs? The number to contact patient is [phone]

## 2021-11-13 NOTE — Telephone Encounter (Signed)
Tried to call patient. No answer. Left message that she needs to be reevaluated before ordering an MRI. Ask her to please call the office to schedule.

## 2021-11-23 ENCOUNTER — Ambulatory Visit: Payer: 59 | Admitting: Orthopaedic Surgery

## 2021-11-23 ENCOUNTER — Encounter: Payer: Self-pay | Admitting: Orthopaedic Surgery

## 2021-11-23 ENCOUNTER — Other Ambulatory Visit: Payer: Self-pay

## 2021-11-23 VITALS — Ht 60.0 in | Wt 103.0 lb

## 2021-11-23 DIAGNOSIS — M545 Low back pain, unspecified: Secondary | ICD-10-CM

## 2021-11-23 NOTE — Progress Notes (Signed)
? ?Office Visit Note ?  ?Patient: Sandra Reeves           ?Date of Birth: 1976-09-05           ?MRN: [id number] ?Visit Date: 11/23/2021 ?             ?Requested by: No referring provider defined for this encounter. ?PCP: Patient, No Pcp Per (Inactive) ? ? ?Assessment & Plan: ?Visit Diagnoses: No diagnosis found. ? ?Plan: 46 year old woman who I saw approximately a month ago for neck and lower back pain.  At that time I placed her on a steroid taper Dosepak.  She comes in today saying the Dosepak did not help her lower back much and she is having progressive symptoms.  She is having pain running down her left greater than right leg.  She does feel like her strength is still intact.  She does say that this morning she had a bowel movement and did not know she had had it.  She also has noticed recently that she has had some trouble urinating.  Her exam today still shows good strength but given the progression of her symptoms I have concerns for a cauda equina syndrome and have ordered a stat MRI of her lumbar spine.  She understands that if she has any worsening of symptoms weakness or progression of loss of bowel or bladder control she is to go to the nearest emergency room. ? ?Follow-Up Instructions: No follow-ups on file.  ? ?Orders:  ?No orders of the defined types were placed in this encounter. ? ?No orders of the defined types were placed in this encounter. ? ? ? ? Procedures: ?No procedures performed ? ? ?Clinical Data: ?No additional findings. ? ? ?Subjective: ?Chief Complaint  ?Patient presents with  ? Left Leg - Pain  ? Right Leg - Pain  ?Patient presents today for her legs. She saw [name] three weeks ago for her lower back and neck pain. She said that she was given oral steroids but they did not help. She has pain in both legs, worse on the left side. She said that they are restless at night. She has been having lower back pain that makes it difficult to walk at times.  ? ? ? ?Review of Systems  ?All  other systems reviewed and are negative. ? ? ?Objective: ?Vital Signs: There were no vitals taken for this visit. ? ?Physical Exam ?Constitutional:   ?   Appearance: Normal appearance.  ?Pulmonary:  ?   Effort: Pulmonary effort is normal.  ?Skin: ?   General: Skin is warm and dry.  ?Neurological:  ?   General: No focal deficit present.  ?   Mental Status: She is alert.  ? ? ?Ortho Exam ?Examination of the lower extremities.  She has 5 out of 5 strength with dorsiflexion plantarflexion of her ankles bilaterally good 5 out of 5 strength with resisted extension and flexion of her knees and flexion of her hips.  She is able to wiggle her toes.  Deep tendon reflexes are intact equal but slightly diminished ?Specialty Comments:  ?No specialty comments available. ? ?Imaging: ?No results found. ? ? ?PMFS History: ?Patient Active Problem List  ? Diagnosis Date Noted  ? Chronic obstructive pulmonary disease (HCC) 06/04/2021  ? ADHD (attention deficit hyperactivity disorder) 06/08/2019  ? Myofascial pain dysfunction syndrome 06/20/2016  ? Spondylosis of lumbosacral region without myelopathy or radiculopathy 06/20/2016  ? Chronic bilateral low back pain without sciatica 03/26/2016  ? H/O  osteoporosis 12/27/2015  ? Concussion syndrome 11/30/2014  ? Other accident caused by striking against or being struck accidentally by objects or persons with or without subsequent fall 11/30/2014  ? Back pain 11/28/2011  ? ?Past Medical History:  ?Diagnosis Date  ? Osteoporosis   ?  ?No family history on file.  ?Past Surgical History:  ?Procedure Laterality Date  ? ABDOMINAL HYSTERECTOMY    ? ?Social History  ? ?Occupational History  ? Not on file  ?Tobacco Use  ? Smoking status: Heavy Smoker  ?  Packs/day: 1.00  ?  Types: Cigarettes  ? Smokeless tobacco: Never  ?Substance and Sexual Activity  ? Alcohol use: No  ? Drug use: Not on file  ? Sexual activity: Not on file  ? ? ? ? ? ? ?

## 2021-11-24 ENCOUNTER — Ambulatory Visit (HOSPITAL_COMMUNITY)
Admission: RE | Admit: 2021-11-24 | Discharge: 2021-11-24 | Disposition: A | Discharge status: Home / Self Care | Payer: 59 | Source: Ambulatory Visit | Attending: Orthopaedic Surgery | Admitting: Orthopaedic Surgery

## 2021-11-24 DIAGNOSIS — M5137 Other intervertebral disc degeneration, lumbosacral region: Secondary | ICD-10-CM | POA: Diagnosis not present

## 2021-11-24 DIAGNOSIS — M545 Low back pain, unspecified: Secondary | ICD-10-CM | POA: Diagnosis present

## 2021-11-24 NOTE — Progress Notes (Signed)
FYI

## 2021-11-29 NOTE — Progress Notes (Signed)
I have tried to call patient with results of MRI. The preferred number goes to a voicemail of someone else who is not listed as a contact. Have also tried home number which has no specific voicemail. MRI was normal and want her to be referred to neurology ?

## 2021-11-30 ENCOUNTER — Other Ambulatory Visit: Payer: Self-pay

## 2021-11-30 ENCOUNTER — Telehealth: Payer: Self-pay | Admitting: Physician Assistant

## 2021-11-30 NOTE — Telephone Encounter (Signed)
Pt returned call to PA [facility]. Told pt PA Audrea Muscat will return call after lunch at [phone]. ?

## 2021-11-30 NOTE — Telephone Encounter (Signed)
Referral has been placed. 

## 2021-11-30 NOTE — Telephone Encounter (Signed)
Spoke to patient. MRI of her back was essentially normal and does not explain her neuropathic symptoms. Can we put in a neurology consult and I also advised her to see her PCP ?

## 2021-12-01 NOTE — Progress Notes (Signed)
Spoke with the patient regarding the results of her MRI.  Certainly not quite normal.  Given her neuropathic burning and numbness I think she should follow-up with either a neurologist which we will place a referral for or a for primary care doctor.  If her symptoms significantly increased she should go to the emergency room ?

## 2021-12-11 ENCOUNTER — Telehealth: Payer: Self-pay | Admitting: Physician Assistant

## 2021-12-11 NOTE — Telephone Encounter (Signed)
Received vm from pt requesting records to be sent to Gaye Alken at [facility]. IC pt,lmvm advised that she will need to complete & sign authorization form. [phone] ?

## 2021-12-18 ENCOUNTER — Telehealth: Payer: Self-pay | Admitting: Physician Assistant

## 2021-12-18 NOTE — Telephone Encounter (Signed)
Records re-faxed [facility] [phone] ?

## 2022-01-05 ENCOUNTER — Telehealth: Payer: Self-pay | Admitting: Physician Assistant

## 2022-01-05 NOTE — Telephone Encounter (Signed)
IC, spoke with patient. She stated [facility] told her what they received was blank. I told patient we faxed records and would re-fax which I did. [phone] ?

## 2022-01-05 NOTE — Telephone Encounter (Signed)
Pt called. She states the form sent to [facility] was blank. Please call her ? ?CB [phone]  ?

## 2022-02-15 ENCOUNTER — Telehealth: Payer: Self-pay | Admitting: *Deleted

## 2022-02-15 NOTE — Telephone Encounter (Signed)
Received call from pt wanting to know if can send her MRI report to [facility] for medical record review for her disabliity, pt has already signed a ROI form to [facility] so I sent records to [phone].

## 2022-04-11 ENCOUNTER — Other Ambulatory Visit: Payer: Self-pay

## 2022-04-11 ENCOUNTER — Emergency Department
Admission: EM | Admit: 2022-04-11 | Discharge: 2022-04-11 | Disposition: A | Discharge status: Home / Self Care | Payer: 59 | Attending: Emergency Medicine | Admitting: Emergency Medicine

## 2022-04-11 DIAGNOSIS — Z23 Encounter for immunization: Secondary | ICD-10-CM | POA: Diagnosis not present

## 2022-04-11 DIAGNOSIS — S51811A Laceration without foreign body of right forearm, initial encounter: Secondary | ICD-10-CM | POA: Diagnosis not present

## 2022-04-11 DIAGNOSIS — S59911A Unspecified injury of right forearm, initial encounter: Secondary | ICD-10-CM | POA: Diagnosis present

## 2022-04-11 DIAGNOSIS — S41111A Laceration without foreign body of right upper arm, initial encounter: Secondary | ICD-10-CM

## 2022-04-11 DIAGNOSIS — W293XXA Contact with powered garden and outdoor hand tools and machinery, initial encounter: Secondary | ICD-10-CM | POA: Insufficient documentation

## 2022-04-11 MED ORDER — TETANUS-DIPHTH-ACELL PERTUSSIS 5-2.5-18.5 LF-MCG/0.5 IM SUSY
0.5000 mL | PREFILLED_SYRINGE | Freq: Once | INTRAMUSCULAR | Status: AC
  Administered 2022-04-11: 0.5 mL via INTRAMUSCULAR
  Filled 2022-04-11: qty 0.5

## 2022-04-11 MED ORDER — LIDOCAINE HCL (PF) 1 % IJ SOLN
5.0000 mL | Freq: Once | INTRAMUSCULAR | Status: AC
  Administered 2022-04-11: 5 mL
  Filled 2022-04-11: qty 5

## 2022-04-11 MED ORDER — CEPHALEXIN 500 MG PO CAPS: 500.0000 mg | ORAL_CAPSULE | Freq: Three times a day (TID) | ORAL | 0 refills | Status: DC

## 2022-04-11 MED ORDER — CEPHALEXIN 500 MG PO CAPS
500.0000 mg | ORAL_CAPSULE | Freq: Once | ORAL | Status: AC
  Administered 2022-04-11: 500 mg via ORAL
  Filled 2022-04-11: qty 1

## 2022-04-11 NOTE — ED Triage Notes (Signed)
Pt with laceration noted to right forearm with chainsaw.

## 2022-04-11 NOTE — ED Triage Notes (Signed)
Pt has full ROM to hand and fingers, states has slight tingling to hand but good sensation throughout.

## 2022-04-11 NOTE — Discharge Instructions (Addendum)
Please keep the area clean and dry.  Please keep the dressing in place for the next 2 days at which time you may remove the dressing and replace with Neosporin and a bandage.  Please follow-up with your doctor in 10 days for wound recheck as well as suture removal.  Return to the emergency department or your doctor sooner for any signs of infection such as increased redness, pus, fever or any other symptom personally concerning to yourself.  Please take your antibiotic for the next 10 days as well.

## 2022-04-11 NOTE — ED Provider Notes (Signed)
[facility] Provider Note    Event Date/Time   First MD Initiated Contact with Patient 04/11/22 1315     (approximate)  History   Chief Complaint: Laceration  HPI  Sandra Reeves is a 46 y.o. female with no significant past medical history presents to the emergency department after a laceration to her right forearm.  According to the patient she was using a chainsaw when it slipped hitting her in the right forearm.  Patient states bleeding at that time but is now hemostatic.  Patient has full range of motion in all of her fingers denies any numbness.  Unknown last tetanus.  Physical Exam   Triage Vital Signs: ED Triage Vitals  Enc Vitals Group     BP 04/11/22 1244 138/90     Pulse Rate 04/11/22 1244 71     Resp 04/11/22 1244 18     Temp 04/11/22 1244 99.2 F (37.3 C)     Temp Source 04/11/22 1244 Oral     SpO2 04/11/22 1244 100 %     Weight 04/11/22 1245 100 lb (45.4 kg)     Height 04/11/22 1245 5' (1.524 m)     Head Circumference --      Peak Flow --      Pain Score 04/11/22 1245 3     Pain Loc --      Pain Edu? --      Excl. in GC? --     Most recent vital signs: Vitals:   04/11/22 1244 04/11/22 1403  BP: 138/90 140/85  Pulse: 71 60  Resp: 18 17  Temp: 99.2 F (37.3 C)   SpO2: 100% 100%    General: Awake, no distress.  CV:  Good peripheral perfusion.  Regular rate and rhythm  Resp:  Normal effort.  Equal breath sounds bilaterally.  Abd:  No distention.  Soft, nontender.  No rebound or guarding. Other:  Patient has an approximate 6 cm laceration that is fairly macerated to the medial aspect of the proximal right forearm.  Tendon function is fully intact able to move all fingers.  I am able to fully visualize muscle body through the laceration but there is no damage to the muscle body.  Sensation is intact and normal in all fingers as well.  Good cap refill in all fingers.   ED Results / Procedures / Treatments   MEDICATIONS ORDERED  IN ED: Medications  Tdap (BOOSTRIX) injection 0.5 mL (0.5 mLs Intramuscular Given 04/11/22 1425)  cephALEXin (KEFLEX) capsule 500 mg (500 mg Oral Given 04/11/22 1421)  lidocaine (PF) (XYLOCAINE) 1 % injection 5 mL (5 mLs Infiltration Given by Other 04/11/22 1406)  lidocaine (PF) (XYLOCAINE) 1 % injection 5 mL (5 mLs Infiltration Given by Other 04/11/22 1405)  lidocaine (PF) (XYLOCAINE) 1 % injection 5 mL (5 mLs Infiltration Given by Other 04/11/22 1406)     IMPRESSION / MDM / ASSESSMENT AND PLAN / ED COURSE  I reviewed the triage vital signs and the nursing notes.  Patient's presentation is most consistent with acute presentation with potential threat to life or bodily function.  Patient presents emergency department after chainsaw injury to her right forearm.  Laceration is approximately 6 cm in total but fairly macerated.  Neurovascularly intact distally.  Tendon function intact and normal.  I was able to numb the area and irrigated extensively with normal saline.  I was able to trim some skin and fat and repair with sutures.  Simple interrupted.  We will update the patient's tetanus shot in the emergency department.  We will discharge on Keflex for the next 10 days have the patient follow-up with her doctor in 10 days for suture removal.  I discussed return precautions for signs of infection or worsening pain.  Patient agreeable to plan.  LACERATION REPAIR Performed by: Minna Antis Authorized by: Minna Antis Consent: Verbal consent obtained. Risks and benefits: risks, benefits and alternatives were discussed Consent given by: patient Patient identity confirmed: provided demographic data Prepped and Draped in normal sterile fashion Wound explored  Laceration Location: Right forearm  Laceration Length: 6 cm  No Foreign Bodies seen or palpated  Anesthesia: local infiltration  Local anesthetic: lidocaine 1% without epinephrine  Anesthetic total: 10 ml  Irrigation method:  syringe Amount of cleaning: standard  Skin closure: 4-0 nylon  Number of sutures: 15  Technique: Simple interrupted  Patient tolerance: Patient tolerated the procedure well with no immediate complications.   FINAL CLINICAL IMPRESSION(S) / ED DIAGNOSES   Laceration    Note:  This document was prepared using Dragon voice recognition software and may include unintentional dictation errors.   Minna Antis, MD 04/11/22 1501

## 2022-06-26 ENCOUNTER — Telehealth (INDEPENDENT_AMBULATORY_CARE_PROVIDER_SITE_OTHER): Payer: Self-pay | Admitting: Nurse Practitioner

## 2022-06-26 NOTE — Telephone Encounter (Signed)
Patient will come by office tomorrow to pick up medical records

## 2022-06-26 NOTE — Telephone Encounter (Signed)
Patient called in requesting her medical records and billing records. I let patient know I would let our in house medical record person know she is requesting her records but we do have a 4-09 policy to get all records together. As far as billing records I gave her our billing department number. Patient was ok with this and I let her know we would call her when everything is ready for pick up, as she has to come in office to sign a document to get her records

## 2022-06-29 DIAGNOSIS — J449 Chronic obstructive pulmonary disease, unspecified: Secondary | ICD-10-CM | POA: Diagnosis not present

## 2022-06-29 DIAGNOSIS — R69 Illness, unspecified: Secondary | ICD-10-CM | POA: Diagnosis not present

## 2022-06-29 DIAGNOSIS — F32A Depression, unspecified: Secondary | ICD-10-CM | POA: Diagnosis not present

## 2022-06-29 DIAGNOSIS — M5440 Lumbago with sciatica, unspecified side: Secondary | ICD-10-CM | POA: Diagnosis not present

## 2022-06-29 DIAGNOSIS — I10 Essential (primary) hypertension: Secondary | ICD-10-CM | POA: Diagnosis not present

## 2022-07-05 ENCOUNTER — Telehealth: Payer: Self-pay | Admitting: Physician Assistant

## 2022-07-05 NOTE — Telephone Encounter (Signed)
Received vm from patient requesting records and bills. IC,lmvm. I advised need to call billing department to obtain billing information and for medical records to come in and sign authorization form. Once I received auth for records, I will call when ready. Pts ph number [phone]

## 2022-07-13 DIAGNOSIS — M545 Low back pain, unspecified: Secondary | ICD-10-CM | POA: Diagnosis not present

## 2022-07-13 DIAGNOSIS — M79604 Pain in right leg: Secondary | ICD-10-CM | POA: Diagnosis not present

## 2022-07-13 DIAGNOSIS — Z90710 Acquired absence of both cervix and uterus: Secondary | ICD-10-CM | POA: Diagnosis not present

## 2022-07-13 DIAGNOSIS — M79605 Pain in left leg: Secondary | ICD-10-CM | POA: Diagnosis not present

## 2022-07-13 DIAGNOSIS — R69 Illness, unspecified: Secondary | ICD-10-CM | POA: Diagnosis not present

## 2022-07-13 DIAGNOSIS — G8929 Other chronic pain: Secondary | ICD-10-CM | POA: Diagnosis not present

## 2022-07-16 ENCOUNTER — Encounter (INDEPENDENT_AMBULATORY_CARE_PROVIDER_SITE_OTHER): Payer: Self-pay

## 2022-09-25 ENCOUNTER — Telehealth: Payer: Self-pay | Admitting: Orthopaedic Surgery

## 2022-09-25 NOTE — Telephone Encounter (Signed)
Received vm from patient requesting to get copy of medical records and bills. IC.lmvm advised will need to come by office to complete authorization form for records.also advised for bills, to contact [facility] billing dept.

## 2022-09-26 ENCOUNTER — Telehealth: Payer: Self-pay | Admitting: Orthopaedic Surgery

## 2022-09-26 NOTE — Telephone Encounter (Signed)
Received call from patient, she didn't get my vm. After explaining that she will need to sign authorization for copy of medical records, I emailed pt auth form per pts request to [email or web address]. She will contact [facility] billing dept for bills. And I will process her request for records upon receipt of her auth.

## 2022-10-30 ENCOUNTER — Ambulatory Visit: Payer: 59 | Admitting: Family

## 2022-10-30 ENCOUNTER — Encounter: Payer: Self-pay | Admitting: Family

## 2022-10-30 VITALS — BP 118/68 | HR 72 | Ht 62.0 in | Wt 101.0 lb

## 2022-10-30 DIAGNOSIS — E782 Mixed hyperlipidemia: Secondary | ICD-10-CM | POA: Diagnosis not present

## 2022-10-30 DIAGNOSIS — R55 Syncope and collapse: Secondary | ICD-10-CM

## 2022-10-30 DIAGNOSIS — F17209 Nicotine dependence, unspecified, with unspecified nicotine-induced disorders: Secondary | ICD-10-CM

## 2022-10-30 DIAGNOSIS — R42 Dizziness and giddiness: Secondary | ICD-10-CM | POA: Diagnosis not present

## 2022-10-30 DIAGNOSIS — G8929 Other chronic pain: Secondary | ICD-10-CM

## 2022-10-30 DIAGNOSIS — R7303 Prediabetes: Secondary | ICD-10-CM

## 2022-10-30 DIAGNOSIS — M5440 Lumbago with sciatica, unspecified side: Secondary | ICD-10-CM

## 2022-10-30 DIAGNOSIS — R002 Palpitations: Secondary | ICD-10-CM | POA: Diagnosis not present

## 2022-10-30 DIAGNOSIS — E611 Iron deficiency: Secondary | ICD-10-CM

## 2022-10-30 DIAGNOSIS — E559 Vitamin D deficiency, unspecified: Secondary | ICD-10-CM

## 2022-10-30 DIAGNOSIS — E538 Deficiency of other specified B group vitamins: Secondary | ICD-10-CM

## 2022-10-30 DIAGNOSIS — J449 Chronic obstructive pulmonary disease, unspecified: Secondary | ICD-10-CM

## 2022-10-30 DIAGNOSIS — I10 Essential (primary) hypertension: Secondary | ICD-10-CM

## 2022-10-30 NOTE — Progress Notes (Signed)
Established Patient Office Visit  Subjective:  Patient ID: Sandra Reeves, female    DOB: 11/04/1975  Age: 47 y.o. MRN: [id number]  Chief Complaint  Patient presents with   Follow-up    4 month follow up    Dizziness This is a new problem. The current episode started more than 1 month ago. The problem occurs every several days. The problem has been unchanged. Associated symptoms include vertigo. Associated symptoms comments: Tinnitus. The symptoms are aggravated by standing. She has tried nothing for the symptoms. The treatment provided no relief.     Past Medical History:  Diagnosis Date   Osteoporosis     Social History   Socioeconomic History   Marital status: Single    Spouse name: Not on file   Number of children: Not on file   Years of education: Not on file   Highest education level: Not on file  Occupational History   Not on file  Tobacco Use   Smoking status: Heavy Smoker    Packs/day: 1.00    Types: Cigarettes   Smokeless tobacco: Never  Substance and Sexual Activity   Alcohol use: No   Drug use: Not on file   Sexual activity: Not on file  Other Topics Concern   Not on file  Social History Narrative   Not on file   Social Determinants of Health   Financial Resource Strain: Not on file  Food Insecurity: Not on file  Transportation Needs: Not on file  Physical Activity: Not on file  Stress: Not on file  Social Connections: Not on file  Intimate Partner Violence: Not on file    History reviewed. No pertinent family history.  Allergies  Allergen Reactions   Duloxetine Other (See Comments)    Dehydration, muscle contraction in hands, vomit  Other reaction(s): Unknown  Dehydration, muscle contraction in hands, vomit    Other reaction(s): Unknown Dehydration, muscle contraction in hands, vomit    Dehydration, muscle contraction in hands, vomit Other reaction(s): Unknown Dehydration, muscle contraction in hands, vomit    Review of Systems   Neurological:  Positive for dizziness and vertigo.  All other systems reviewed and are negative.      Objective:   BP 118/68   Pulse 72   Ht 5' 2"$  (1.575 m)   Wt 101 lb (45.8 kg)   SpO2 99%   BMI 18.47 kg/m   Vitals:   10/30/22 1302  BP: 118/68  Pulse: 72  Height: 5' 2"$  (1.575 m)  Weight: 101 lb (45.8 kg)  SpO2: 99%  BMI (Calculated): 18.47    Physical Exam Vitals and nursing note reviewed.  Constitutional:      Appearance: Normal appearance.  Neurological:     Mental Status: She is alert.  Psychiatric:        Behavior: Behavior is cooperative.      No results found for any visits on 10/30/22.  No results found for this or any previous visit (from the past 2160 hour(s)).    Assessment & Plan:   Problem List Items Addressed This Visit     Chronic obstructive pulmonary disease ([place])   Tobacco use disorder, continuous   Mixed hyperlipidemia   Relevant Orders   Lipid panel   Other Visit Diagnoses     Palpitations    -  Primary   EKG normal Will get patient set up for Holter   Relevant Orders   EKG 12-Lead   TSH   Iron, TIBC and Ferritin  Panel   AMBULATORY REFERRAL FOR HOLTER MONITORING   Dizziness       Relevant Orders   TSH   Iron, TIBC and Ferritin Panel   AMBULATORY REFERRAL FOR HOLTER MONITORING   Chronic low back pain with sciatica, sciatica laterality unspecified, unspecified back pain laterality       Relevant Medications   baclofen (LIORESAL) 10 MG tablet   buPROPion (WELLBUTRIN XL) 150 MG 24 hr tablet   amitriptyline (ELAVIL) 25 MG tablet   traMADol (ULTRAM) 50 MG tablet   Postural dizziness with presyncope       Relevant Orders   TSH   Iron, TIBC and Ferritin Panel   AMBULATORY REFERRAL FOR HOLTER MONITORING   Vitamin D deficiency, unspecified       Checking labs today - will send results via mychart Will send supplementation as needed.   Relevant Orders   VITAMIN D 25 Hydroxy (Vit-D Deficiency, Fractures)   Essential  hypertension, benign       Relevant Orders   CBC With Differential   CMP14+EGFR   B12 deficiency due to diet       Checking labs today - will send results via mychart Will send supplementation as needed.   Relevant Orders   Vitamin B12   Prediabetes       Checking labs today - will send results via mychart Will follow up and change meds as needed.   Relevant Orders   Hemoglobin A1c   Iron deficiency       Checking labs today - will send results via mychart Will send supplementation as needed.   Relevant Orders   CBC With Differential   CMP14+EGFR       Return in about 4 weeks (around 11/27/2022).   Total time spent: 25 minutes  Mechele Claude, FNP  10/30/2022

## 2022-10-31 LAB — CMP14+EGFR
ALT: 12 IU/L (ref 0–32)
AST: 13 IU/L (ref 0–40)
Albumin/Globulin Ratio: 1.9 (ref 1.2–2.2)
Albumin: 5 g/dL — ABNORMAL HIGH (ref 3.9–4.9)
Alkaline Phosphatase: 149 IU/L — ABNORMAL HIGH (ref 44–121)
BUN/Creatinine Ratio: 20 (ref 9–23)
BUN: 16 mg/dL (ref 6–24)
Bilirubin Total: 0.4 mg/dL (ref 0.0–1.2)
CO2: 23 mmol/L (ref 20–29)
Calcium: 10.3 mg/dL — ABNORMAL HIGH (ref 8.7–10.2)
Chloride: 100 mmol/L (ref 96–106)
Creatinine, Ser: 0.82 mg/dL (ref 0.57–1.00)
Globulin, Total: 2.7 g/dL (ref 1.5–4.5)
Glucose: 83 mg/dL (ref 70–99)
Potassium: 4.3 mmol/L (ref 3.5–5.2)
Sodium: 138 mmol/L (ref 134–144)
Total Protein: 7.7 g/dL (ref 6.0–8.5)
eGFR: 90 mL/min/{1.73_m2} (ref >59)

## 2022-10-31 LAB — CBC WITH DIFFERENTIAL
Basophils Absolute: 0.1 10*3/uL (ref 0.0–0.2)
Basos: 1 %
EOS (ABSOLUTE): 0.1 10*3/uL (ref 0.0–0.4)
Eos: 1 %
Hematocrit: 43.3 % (ref 34.0–46.6)
Hemoglobin: 15.2 g/dL (ref 11.1–15.9)
Immature Grans (Abs): 0 10*3/uL (ref 0.0–0.1)
Immature Granulocytes: 0 %
Lymphocytes Absolute: 3.4 10*3/uL — ABNORMAL HIGH (ref 0.7–3.1)
Lymphs: 30 %
MCH: 31.6 pg (ref 26.6–33.0)
MCHC: 35.1 g/dL (ref 31.5–35.7)
MCV: 90 fL (ref 79–97)
Monocytes Absolute: 0.9 10*3/uL (ref 0.1–0.9)
Monocytes: 8 %
Neutrophils Absolute: 6.8 10*3/uL (ref 1.4–7.0)
Neutrophils: 60 %
RBC: 4.81 x10E6/uL (ref 3.77–5.28)
RDW: 11.9 % (ref 11.7–15.4)
WBC: 11.2 10*3/uL — ABNORMAL HIGH (ref 3.4–10.8)

## 2022-10-31 LAB — HEMOGLOBIN A1C
Est. average glucose Bld gHb Est-mCnc: 117 mg/dL
Hgb A1c MFr Bld: 5.7 % — ABNORMAL HIGH (ref 4.8–5.6)

## 2022-10-31 LAB — LIPID PANEL
Chol/HDL Ratio: 3.5 ratio (ref 0.0–4.4)
Cholesterol, Total: 221 mg/dL — ABNORMAL HIGH (ref 100–199)
HDL: 64 mg/dL (ref >39)
LDL Chol Calc (NIH): 142 mg/dL — ABNORMAL HIGH (ref 0–99)
Triglycerides: 88 mg/dL (ref 0–149)
VLDL Cholesterol Cal: 15 mg/dL (ref 5–40)

## 2022-10-31 LAB — IRON,TIBC AND FERRITIN PANEL
Ferritin: 204 ng/mL — ABNORMAL HIGH (ref 15–150)
Iron Saturation: 34 % (ref 15–55)
Iron: 109 ug/dL (ref 27–159)
Total Iron Binding Capacity: 318 ug/dL (ref 250–450)
UIBC: 209 ug/dL (ref 131–425)

## 2022-10-31 LAB — TSH: TSH: 0.535 u[IU]/mL (ref 0.450–4.500)

## 2022-10-31 LAB — VITAMIN D 25 HYDROXY (VIT D DEFICIENCY, FRACTURES): Vit D, 25-Hydroxy: 32.1 ng/mL (ref 30.0–100.0)

## 2022-10-31 LAB — VITAMIN B12: Vitamin B-12: 504 pg/mL (ref 232–1245)

## 2022-11-02 ENCOUNTER — Telehealth: Payer: Self-pay

## 2022-11-02 NOTE — Telephone Encounter (Signed)
Pt called and left vm regarding rx's not being at the pharmacy, said it was discussed at appt please advise

## 2022-11-05 MED ORDER — NYSTATIN 100000 UNIT/ML MT SUSP: 5.0000 mL | Freq: Four times a day (QID) | OROMUCOSAL | 0 refills | Status: DC

## 2022-11-05 NOTE — Addendum Note (Signed)
Addended by: Georgian Co on: 11/05/2022 10:49 AM   Modules accepted: Orders

## 2022-11-27 ENCOUNTER — Ambulatory Visit: Payer: 59 | Admitting: Family

## 2022-11-27 VITALS — BP 120/72 | HR 70 | Ht 60.0 in | Wt 96.6 lb

## 2022-11-27 DIAGNOSIS — J441 Chronic obstructive pulmonary disease with (acute) exacerbation: Secondary | ICD-10-CM | POA: Diagnosis not present

## 2022-11-27 DIAGNOSIS — R42 Dizziness and giddiness: Secondary | ICD-10-CM | POA: Diagnosis not present

## 2022-11-27 DIAGNOSIS — R55 Syncope and collapse: Secondary | ICD-10-CM

## 2022-11-27 DIAGNOSIS — R002 Palpitations: Secondary | ICD-10-CM

## 2022-11-28 ENCOUNTER — Encounter: Payer: Self-pay | Admitting: Family

## 2022-11-28 NOTE — Progress Notes (Signed)
Established Patient Office Visit  Subjective:  Patient ID: Sandra Reeves, female    DOB: Sep 30, 1975  Age: 47 y.o. MRN: [id number]  Chief Complaint  Patient presents with   Follow-up    1 month follow up    Patient ehre today for her 1 month f/u.  She has been doing similarly to how she had been feeling prior to her last appointment.  Her Holter did get approved, but she has not gotten a call to set up placement for this.   No other concerns today.      Past Medical History:  Diagnosis Date   Osteoporosis     Past Surgical History:  Procedure Laterality Date   ABDOMINAL HYSTERECTOMY      Social History   Socioeconomic History   Marital status: Single    Spouse name: Not on file   Number of children: Not on file   Years of education: Not on file   Highest education level: Not on file  Occupational History   Not on file  Tobacco Use   Smoking status: Heavy Smoker    Packs/day: 1.00    Types: Cigarettes   Smokeless tobacco: Never  Substance and Sexual Activity   Alcohol use: No   Drug use: Not on file   Sexual activity: Not on file  Other Topics Concern   Not on file  Social History Narrative   Not on file   Social Determinants of Health   Financial Resource Strain: Not on file  Food Insecurity: Not on file  Transportation Needs: Not on file  Physical Activity: Not on file  Stress: Not on file  Social Connections: Not on file  Intimate Partner Violence: Not on file    No family history on file.  Allergies  Allergen Reactions   Duloxetine Other (See Comments)    Dehydration, muscle contraction in hands, vomit  Other reaction(s): Unknown  Dehydration, muscle contraction in hands, vomit    Other reaction(s): Unknown Dehydration, muscle contraction in hands, vomit    Dehydration, muscle contraction in hands, vomit Other reaction(s): Unknown Dehydration, muscle contraction in hands, vomit    Review of Systems  Constitutional:  Positive for  malaise/fatigue.  Neurological:  Positive for dizziness.  All other systems reviewed and are negative.      Objective:   BP 120/72   Pulse 70   Ht 5' (1.524 m)   Wt 96 lb 9.6 oz (43.8 kg)   SpO2 99%   BMI 18.87 kg/m   Vitals:   11/27/22 1323  BP: 120/72  Pulse: 70  Height: 5' (1.524 m)  Weight: 96 lb 9.6 oz (43.8 kg)  SpO2: 99%  BMI (Calculated): 18.87    Physical Exam Vitals and nursing note reviewed.  Constitutional:      Appearance: Normal appearance. She is normal weight.  HENT:     Head: Normocephalic.  Eyes:     Pupils: Pupils are equal, round, and reactive to light.  Cardiovascular:     Rate and Rhythm: Normal rate.  Pulmonary:     Effort: Pulmonary effort is normal.  Neurological:     Mental Status: She is alert.      No results found for any visits on 11/27/22.  Recent Results (from the past 2160 hour(s))  Lipid panel     Status: Abnormal   Collection Time: 10/30/22  2:05 PM  Result Value Ref Range   Cholesterol, Total 221 (H) 100 - 199 mg/dL   Triglycerides 88  0 - 149 mg/dL   HDL 64 >39 mg/dL   VLDL Cholesterol Cal 15 5 - 40 mg/dL   LDL Chol Calc (NIH) 142 (H) 0 - 99 mg/dL   Chol/HDL Ratio 3.5 0.0 - 4.4 ratio    Comment:                                   T. Chol/HDL Ratio                                             Men  Women                               1/2 Avg.Risk  3.4    3.3                                   Avg.Risk  5.0    4.4                                2X Avg.Risk  9.6    7.1                                3X Avg.Risk 23.4   11.0   VITAMIN D 25 Hydroxy (Vit-D Deficiency, Fractures)     Status: None   Collection Time: 10/30/22  2:05 PM  Result Value Ref Range   Vit D, 25-Hydroxy 32.1 30.0 - 100.0 ng/mL    Comment: Vitamin D deficiency has been defined by the [place] practice guideline as a level of serum 25-OH vitamin D less than 20 ng/mL (1,2). The Endocrine Society went on to further  define vitamin D insufficiency as a level between 21 and 29 ng/mL (2). 1. IOM (Institute of Medicine). 2010. Dietary reference    intakes for calcium and D. [place]: The    [employer]. 2. Holick MF, Binkley [place], Bischoff-Ferrari HA, et al.    Evaluation, treatment, and prevention of vitamin D    deficiency: an Endocrine Society clinical practice    guideline. JCEM. 2011 Jul; 96(7):1911-30.   CBC With Differential     Status: Abnormal   Collection Time: 10/30/22  2:05 PM  Result Value Ref Range   WBC 11.2 (H) 3.4 - 10.8 x10E3/uL   RBC 4.81 3.77 - 5.28 x10E6/uL   Hemoglobin 15.2 11.1 - 15.9 g/dL   Hematocrit 43.3 34.0 - 46.6 %   MCV 90 79 - 97 fL   MCH 31.6 26.6 - 33.0 pg   MCHC 35.1 31.5 - 35.7 g/dL   RDW 11.9 11.7 - 15.4 %   Neutrophils 60 Not Estab. %   Lymphs 30 Not Estab. %   Monocytes 8 Not Estab. %   Eos 1 Not Estab. %   Basos 1 Not Estab. %   Neutrophils Absolute 6.8 1.4 - 7.0 x10E3/uL   Lymphocytes Absolute 3.4 (H) 0.7 - 3.1 x10E3/uL   Monocytes Absolute 0.9 0.1 - 0.9 x10E3/uL   EOS (ABSOLUTE) 0.1 0.0 - 0.4 x10E3/uL  Basophils Absolute 0.1 0.0 - 0.2 x10E3/uL   Immature Granulocytes 0 Not Estab. %   Immature Grans (Abs) 0.0 0.0 - 0.1 x10E3/uL  CMP14+EGFR     Status: Abnormal   Collection Time: 10/30/22  2:05 PM  Result Value Ref Range   Glucose 83 70 - 99 mg/dL   BUN 16 6 - 24 mg/dL   Creatinine, Ser 0.82 0.57 - 1.00 mg/dL   eGFR 90 >59 mL/min/1.73   BUN/Creatinine Ratio 20 9 - 23   Sodium 138 134 - 144 mmol/L   Potassium 4.3 3.5 - 5.2 mmol/L   Chloride 100 96 - 106 mmol/L   CO2 23 20 - 29 mmol/L   Calcium 10.3 (H) 8.7 - 10.2 mg/dL   Total Protein 7.7 6.0 - 8.5 g/dL   Albumin 5.0 (H) 3.9 - 4.9 g/dL   Globulin, Total 2.7 1.5 - 4.5 g/dL   Albumin/Globulin Ratio 1.9 1.2 - 2.2   Bilirubin Total 0.4 0.0 - 1.2 mg/dL   Alkaline Phosphatase 149 (H) 44 - 121 IU/L   AST 13 0 - 40 IU/L   ALT 12 0 - 32 IU/L  TSH     Status: None   Collection Time:  10/30/22  2:05 PM  Result Value Ref Range   TSH 0.535 0.450 - 4.500 uIU/mL  Hemoglobin A1c     Status: Abnormal   Collection Time: 10/30/22  2:05 PM  Result Value Ref Range   Hgb A1c MFr Bld 5.7 (H) 4.8 - 5.6 %    Comment:          Prediabetes: 5.7 - 6.4          Diabetes: >6.4          Glycemic control for adults with diabetes: <7.0    Est. average glucose Bld gHb Est-mCnc 117 mg/dL  Vitamin B12     Status: None   Collection Time: 10/30/22  2:05 PM  Result Value Ref Range   Vitamin B-12 504 232 - 1,245 pg/mL  Iron, TIBC and Ferritin Panel     Status: Abnormal   Collection Time: 10/30/22  2:05 PM  Result Value Ref Range   Total Iron Binding Capacity 318 250 - 450 ug/dL   UIBC 209 131 - 425 ug/dL   Iron 109 27 - 159 ug/dL   Iron Saturation 34 15 - 55 %   Ferritin 204 (H) 15 - 150 ng/mL      Assessment & Plan:   Problem List Items Addressed This Visit     Chronic obstructive pulmonary disease ([place])   Other Visit Diagnoses     Dizziness    -  Primary   Palpitations       Postural dizziness with presyncope         Will set up appointment for holter placement next week, hopefully this will give [place] enough time to figure out how to document this.    Return in about 1 month (around 12/28/2022).   Total time spent: 20 minutes  Mechele Claude, FNP  11/27/2022

## 2022-12-04 ENCOUNTER — Ambulatory Visit: Payer: 59

## 2022-12-06 ENCOUNTER — Telehealth: Payer: Self-pay | Admitting: Orthopaedic Surgery

## 2022-12-06 NOTE — Telephone Encounter (Signed)
Received vm from [facility] w/ [place]. IC, lmvm advised that I personally processed a request for the patient on 10/08/22 where I faxed and mailed medical records to them.ph [phone]

## 2022-12-09 ENCOUNTER — Other Ambulatory Visit: Payer: Self-pay | Admitting: Family

## 2022-12-10 ENCOUNTER — Encounter: Payer: Self-pay | Admitting: Cardiovascular Disease

## 2022-12-11 ENCOUNTER — Ambulatory Visit: Payer: 59 | Admitting: Family

## 2022-12-11 ENCOUNTER — Encounter: Payer: Self-pay | Admitting: Family

## 2022-12-11 VITALS — BP 122/60 | HR 65 | Ht 60.0 in | Wt 96.2 lb

## 2022-12-11 DIAGNOSIS — R002 Palpitations: Secondary | ICD-10-CM

## 2022-12-11 DIAGNOSIS — R42 Dizziness and giddiness: Secondary | ICD-10-CM

## 2022-12-11 DIAGNOSIS — R079 Chest pain, unspecified: Secondary | ICD-10-CM | POA: Diagnosis not present

## 2022-12-11 DIAGNOSIS — K219 Gastro-esophageal reflux disease without esophagitis: Secondary | ICD-10-CM | POA: Diagnosis not present

## 2022-12-11 MED ORDER — OMEPRAZOLE 40 MG PO CPDR: 40.0000 mg | DELAYED_RELEASE_CAPSULE | Freq: Every day | ORAL | 1 refills | Status: DC

## 2022-12-11 NOTE — Progress Notes (Signed)
Established Patient Office Visit  Subjective:  Patient ID: Sandra Reeves, female    DOB: Aug 06, 1976  Age: 47 y.o. MRN: [id number]  Chief Complaint  Patient presents with   Follow-up    Acid Reflux    Patient is here today for her follow up  She turned in her Zywie today, we do not yet have back the complete report.   She has been continuing to have dizzy spells, palpitations, presyncope, and chest pains.   She also has been having some issues with reflux, says that this is different than her other chest pains.   No other concerns at this time.   Past Medical History:  Diagnosis Date   Osteoporosis     Past Surgical History:  Procedure Laterality Date   ABDOMINAL HYSTERECTOMY      Social History   Socioeconomic History   Marital status: Single    Spouse name: Not on file   Number of children: Not on file   Years of education: Not on file   Highest education level: Not on file  Occupational History   Not on file  Tobacco Use   Smoking status: Heavy Smoker    Packs/day: 1    Types: Cigarettes   Smokeless tobacco: Never  Substance and Sexual Activity   Alcohol use: No   Drug use: Not on file   Sexual activity: Not on file  Other Topics Concern   Not on file  Social History Narrative   Not on file   Social Determinants of Health   Financial Resource Strain: Not on file  Food Insecurity: Not on file  Transportation Needs: Not on file  Physical Activity: Not on file  Stress: Not on file  Social Connections: Not on file  Intimate Partner Violence: Not on file    No family history on file.  Allergies  Allergen Reactions   Duloxetine Other (See Comments)    Dehydration, muscle contraction in hands, vomit  Other reaction(s): Unknown  Dehydration, muscle contraction in hands, vomit    Other reaction(s): Unknown Dehydration, muscle contraction in hands, vomit    Dehydration, muscle contraction in hands, vomit Other reaction(s): Unknown  Dehydration, muscle contraction in hands, vomit    ROS     Objective:   BP 122/60   Pulse 65   Ht 5' (1.524 m)   Wt 96 lb 3.2 oz (43.6 kg)   SpO2 99%   BMI 18.79 kg/m   Vitals:   12/11/22 1046  BP: 122/60  Pulse: 65  Height: 5' (1.524 m)  Weight: 96 lb 3.2 oz (43.6 kg)  SpO2: 99%  BMI (Calculated): 18.79    Physical Exam   No results found for any visits on 12/11/22.  Recent Results (from the past 2160 hour(s))  Lipid panel     Status: Abnormal   Collection Time: 10/30/22  2:05 PM  Result Value Ref Range   Cholesterol, Total 221 (H) 100 - 199 mg/dL   Triglycerides 88 0 - 149 mg/dL   HDL 64 >39 mg/dL   VLDL Cholesterol Cal 15 5 - 40 mg/dL   LDL Chol Calc (NIH) 142 (H) 0 - 99 mg/dL   Chol/HDL Ratio 3.5 0.0 - 4.4 ratio    Comment:                                   T. Chol/HDL Ratio  Men  Women                               1/2 Avg.Risk  3.4    3.3                                   Avg.Risk  5.0    4.4                                2X Avg.Risk  9.6    7.1                                3X Avg.Risk 23.4   11.0   VITAMIN D 25 Hydroxy (Vit-D Deficiency, Fractures)     Status: None   Collection Time: 10/30/22  2:05 PM  Result Value Ref Range   Vit D, 25-Hydroxy 32.1 30.0 - 100.0 ng/mL    Comment: Vitamin D deficiency has been defined by the [place] practice guideline as a level of serum 25-OH vitamin D less than 20 ng/mL (1,2). The Endocrine Society went on to further define vitamin D insufficiency as a level between 21 and 29 ng/mL (2). 1. IOM (Institute of Medicine). 2010. Dietary reference    intakes for calcium and D. [place]: The    [employer]. 2. Holick MF, Binkley [place], Bischoff-Ferrari HA, et al.    Evaluation, treatment, and prevention of vitamin D    deficiency: an Endocrine Society clinical practice    guideline. JCEM. 2011 Jul; 96(7):1911-30.   CBC  With Differential     Status: Abnormal   Collection Time: 10/30/22  2:05 PM  Result Value Ref Range   WBC 11.2 (H) 3.4 - 10.8 x10E3/uL   RBC 4.81 3.77 - 5.28 x10E6/uL   Hemoglobin 15.2 11.1 - 15.9 g/dL   Hematocrit 43.3 34.0 - 46.6 %   MCV 90 79 - 97 fL   MCH 31.6 26.6 - 33.0 pg   MCHC 35.1 31.5 - 35.7 g/dL   RDW 11.9 11.7 - 15.4 %   Neutrophils 60 Not Estab. %   Lymphs 30 Not Estab. %   Monocytes 8 Not Estab. %   Eos 1 Not Estab. %   Basos 1 Not Estab. %   Neutrophils Absolute 6.8 1.4 - 7.0 x10E3/uL   Lymphocytes Absolute 3.4 (H) 0.7 - 3.1 x10E3/uL   Monocytes Absolute 0.9 0.1 - 0.9 x10E3/uL   EOS (ABSOLUTE) 0.1 0.0 - 0.4 x10E3/uL   Basophils Absolute 0.1 0.0 - 0.2 x10E3/uL   Immature Granulocytes 0 Not Estab. %   Immature Grans (Abs) 0.0 0.0 - 0.1 x10E3/uL  CMP14+EGFR     Status: Abnormal   Collection Time: 10/30/22  2:05 PM  Result Value Ref Range   Glucose 83 70 - 99 mg/dL   BUN 16 6 - 24 mg/dL   Creatinine, Ser 0.82 0.57 - 1.00 mg/dL   eGFR 90 >59 mL/min/1.73   BUN/Creatinine Ratio 20 9 - 23   Sodium 138 134 - 144 mmol/L   Potassium 4.3 3.5 - 5.2 mmol/L   Chloride 100 96 - 106 mmol/L   CO2 23 20 - 29 mmol/L   Calcium 10.3 (H) 8.7 - 10.2  mg/dL   Total Protein 7.7 6.0 - 8.5 g/dL   Albumin 5.0 (H) 3.9 - 4.9 g/dL   Globulin, Total 2.7 1.5 - 4.5 g/dL   Albumin/Globulin Ratio 1.9 1.2 - 2.2   Bilirubin Total 0.4 0.0 - 1.2 mg/dL   Alkaline Phosphatase 149 (H) 44 - 121 IU/L   AST 13 0 - 40 IU/L   ALT 12 0 - 32 IU/L  TSH     Status: None   Collection Time: 10/30/22  2:05 PM  Result Value Ref Range   TSH 0.535 0.450 - 4.500 uIU/mL  Hemoglobin A1c     Status: Abnormal   Collection Time: 10/30/22  2:05 PM  Result Value Ref Range   Hgb A1c MFr Bld 5.7 (H) 4.8 - 5.6 %    Comment:          Prediabetes: 5.7 - 6.4          Diabetes: >6.4          Glycemic control for adults with diabetes: <7.0    Est. average glucose Bld gHb Est-mCnc 117 mg/dL  Vitamin B12     Status: None    Collection Time: 10/30/22  2:05 PM  Result Value Ref Range   Vitamin B-12 504 232 - 1,245 pg/mL  Iron, TIBC and Ferritin Panel     Status: Abnormal   Collection Time: 10/30/22  2:05 PM  Result Value Ref Range   Total Iron Binding Capacity 318 250 - 450 ug/dL   UIBC 209 131 - 425 ug/dL   Iron 109 27 - 159 ug/dL   Iron Saturation 34 15 - 55 %   Ferritin 204 (H) 15 - 150 ng/mL      Assessment & Plan:   Problem List Items Addressed This Visit   None Visit Diagnoses     Chest pain, unspecified type    -  Primary   Setting up with Cardiology.   Dizziness       Palpitations       Gastroesophageal reflux disease without esophagitis       Sending omeprazole for patient to try. We will follow up at her already scheduled appointment in a few weeks, so we can see if this is helping.   Relevant Medications   omeprazole (PRILOSEC) 40 MG capsule       Return as already scheduled..   Total time spent: 20 minutes  Mechele Claude, FNP  12/11/2022

## 2022-12-19 ENCOUNTER — Ambulatory Visit (INDEPENDENT_AMBULATORY_CARE_PROVIDER_SITE_OTHER): Payer: 59 | Admitting: Cardiology

## 2022-12-19 ENCOUNTER — Encounter: Payer: Self-pay | Admitting: Cardiology

## 2022-12-19 VITALS — BP 118/70 | HR 60 | Ht 60.0 in | Wt 94.8 lb

## 2022-12-19 DIAGNOSIS — E782 Mixed hyperlipidemia: Secondary | ICD-10-CM | POA: Diagnosis not present

## 2022-12-19 DIAGNOSIS — R002 Palpitations: Secondary | ICD-10-CM | POA: Diagnosis not present

## 2022-12-19 DIAGNOSIS — R079 Chest pain, unspecified: Secondary | ICD-10-CM

## 2022-12-19 DIAGNOSIS — F17209 Nicotine dependence, unspecified, with unspecified nicotine-induced disorders: Secondary | ICD-10-CM

## 2022-12-19 HISTORY — DX: Chest pain, unspecified: R07.9

## 2022-12-19 NOTE — Assessment & Plan Note (Signed)
Patient reports occasional palpitations. Gets short of breath with palpitations. Will order echo to check heart function.

## 2022-12-19 NOTE — Assessment & Plan Note (Signed)
LDL 142 10/2022. Discussed importance of lowering LDL, low fast diet, exercise 30 minutes a day. Low cholesterol patient education given to patient.

## 2022-12-19 NOTE — Assessment & Plan Note (Addendum)
Patient complains of left lateral chest pain, gets short of breath with pain. Stress test ordered to check for blockages. Patient unable to walk on treadmill due to COPD and shortness of breath.

## 2022-12-19 NOTE — Progress Notes (Addendum)
Cardiology Office Note   Date:  12/28/2022   ID:  BERNARDA ERCK, DOB May 18, 1976, MRN [id number]  PCP:  Miki Kins, FNP  Cardiologist:  Marisue Ivan, NP      History of Present Illness: Sandra Reeves is a 47 y.o. female who presents for No chief complaint on file.   Patient in office to establish care, discuss results of Holter monitor.   Chest Pain  This is a recurrent problem. The current episode started 1 to 4 weeks ago. The onset quality is undetermined. The pain is present in the lateral region. The pain is mild. The quality of the pain is described as pressure. Associated symptoms include dizziness, palpitations and shortness of breath. Risk factors include post-menopausal, smoking/tobacco exposure and stress.  Her past medical history is significant for hyperlipidemia.  Shortness of Breath This is a recurrent problem. The current episode started 1 to 4 weeks ago. The problem occurs intermittently. Associated symptoms include chest pain. The symptoms are aggravated by exercise. Risk factors include smoking. She has tried rest for the symptoms. Her past medical history is significant for COPD.    Past Medical History:  Diagnosis Date   Osteoporosis      Past Surgical History:  Procedure Laterality Date   ABDOMINAL HYSTERECTOMY       Current Outpatient Medications  Medication Sig Dispense Refill   baclofen (LIORESAL) 10 MG tablet TAKE 1 TABLET BY MOUTH THREE TIMES A DAY AS NEEDED LOWER BACK PAIN 270 tablet 1   buPROPion (WELLBUTRIN XL) 150 MG 24 hr tablet Take 150 mg by mouth daily.     omeprazole (PRILOSEC) 40 MG capsule Take 1 capsule (40 mg total) by mouth daily. 30 capsule 1   traMADol (ULTRAM) 50 MG tablet Take 50 mg by mouth every 6 (six) hours as needed for moderate pain or severe pain.     UBRELVY 100 MG TABS Take 1 tablet by mouth as needed (migraine.  May repeat in 2 hours if needed.).     No current facility-administered medications for this  visit.    Allergies:   Duloxetine    Social History:   reports that she has been smoking cigarettes. She has been smoking an average of 1 pack per day. She has never used smokeless tobacco. She reports that she does not drink alcohol. No history on file for drug use.   Family History:  family history is not on file.    ROS:     Review of Systems  Constitutional: Negative.   HENT: Negative.    Eyes: Negative.   Respiratory:  Positive for shortness of breath.   Cardiovascular:  Positive for chest pain and palpitations.  Gastrointestinal: Negative.   Skin: Negative.   Neurological:  Positive for dizziness.  All other systems reviewed and are negative.   All other systems are reviewed and negative.   PHYSICAL EXAM: VS:  BP 118/70   Pulse 60   Ht 5' (1.524 m)   Wt 43 kg   SpO2 100%   BMI 18.51 kg/m  , BMI Body mass index is 18.51 kg/m. Last weight:  Wt Readings from Last 3 Encounters:  12/19/22 43 kg  12/11/22 43.6 kg  11/27/22 43.8 kg    Physical Exam Vitals and nursing note reviewed.  Constitutional:      Appearance: Normal appearance. She is normal weight.  HENT:     Head: Normocephalic and atraumatic.  Cardiovascular:     Rate and Rhythm:  Normal rate and regular rhythm.     Pulses: Normal pulses.     Heart sounds: Normal heart sounds.  Pulmonary:     Effort: Pulmonary effort is normal.     Breath sounds: Normal breath sounds.  Abdominal:     General: Abdomen is flat.     Palpations: Abdomen is soft.  Musculoskeletal:        General: Normal range of motion.     Cervical back: Normal range of motion.     Right lower leg: No edema.     Left lower leg: No edema.  Skin:    General: Skin is warm and dry.  Neurological:     General: No focal deficit present.     Mental Status: She is alert and oriented to person, place, and time. Mental status is at baseline.  Psychiatric:        Mood and Affect: Mood normal.        Behavior: Behavior normal.     EKG:  none today  Recent Labs: 10/30/2022: ALT 12; BUN 16; Creatinine, Ser 0.82; Hemoglobin 15.2; Potassium 4.3; Sodium 138; TSH 0.535    Lipid Panel    Component Value Date/Time   CHOL 221 (H) 10/30/2022 1405   TRIG 88 10/30/2022 1405   HDL 64 10/30/2022 1405   CHOLHDL 3.5 10/30/2022 1405   LDLCALC 142 (H) 10/30/2022 1405     Other studies Reviewed: none   ASSESSMENT AND PLAN:    ICD-10-CM   1. Palpitations  R00.2 PCV ECHOCARDIOGRAM COMPLETE    2. Mixed hyperlipidemia  E78.2     3. Tobacco use disorder, continuous  F17.209     4. Chest pain, unspecified type  R07.9 MYOCARDIAL PERFUSION IMAGING       Problem List Items Addressed This Visit       Other   Tobacco use disorder, continuous    Discussed importance of smoking cessation. Patient states she is ready to quit but is under stress due to a parent death. Smoking cessation handout given to patient.       Mixed hyperlipidemia    LDL 142 10/2022. Discussed importance of lowering LDL, low fast diet, exercise 30 minutes a day. Low cholesterol patient education given to patient.       Palpitations - Primary    Patient reports occasional palpitations. Gets short of breath with palpitations. Will order echo to check heart function.       Relevant Orders   PCV ECHOCARDIOGRAM COMPLETE   Chest pain    Patient complains of left lateral chest pain, gets short of breath with pain. Stress test ordered to check for blockages. Patient unable to walk on treadmill due to COPD and shortness of breath.       Relevant Orders   MYOCARDIAL PERFUSION IMAGING     Disposition:   Return in about 4 weeks (around 01/16/2023) for after echo and stress test.    Total time spent: 30 minutes  Signed,  [place], NP  12/28/2022 11:55 AM    [facility]

## 2022-12-19 NOTE — Assessment & Plan Note (Signed)
Discussed importance of smoking cessation. Patient states she is ready to quit but is under stress due to a parent death. Smoking cessation handout given to patient.

## 2022-12-28 ENCOUNTER — Ambulatory Visit: Payer: 59 | Admitting: Family

## 2022-12-28 ENCOUNTER — Encounter: Payer: Self-pay | Admitting: Family

## 2022-12-28 VITALS — BP 110/70 | HR 60 | Ht 60.0 in | Wt 93.4 lb

## 2022-12-28 DIAGNOSIS — Z1231 Encounter for screening mammogram for malignant neoplasm of breast: Secondary | ICD-10-CM | POA: Diagnosis not present

## 2022-12-28 DIAGNOSIS — G43019 Migraine without aura, intractable, without status migrainosus: Secondary | ICD-10-CM

## 2022-12-28 MED ORDER — NURTEC 75 MG PO TBDP: 75.0000 mg | ORAL_TABLET | ORAL | 0 refills | Status: AC | PRN

## 2022-12-29 ENCOUNTER — Encounter: Payer: Self-pay | Admitting: Family

## 2022-12-29 NOTE — Progress Notes (Signed)
Established Patient Office Visit  Subjective:  Patient ID: Sandra Reeves, female    DOB: [birth year]  Age: 47 y.o. MRN: [id number]  Chief Complaint  Patient presents with   Follow-up    1 month follow up    Patient is here for her 1 month follow up appointment.  She has been feeling fairly well and has been seeing cardiology since her last appointment.  They have her scheduled for an echo and a stress test.   Her only concern today is that she is  having an increase in her migraines.  She says that the last through sleep, and that the current one has been going on for at least two days.   She tried the [place], but it did not resolve the headaches.   She does need a mammogram.   No other concerns at this time.   Past Medical History:  Diagnosis Date   Osteoporosis     Past Surgical History:  Procedure Laterality Date   ABDOMINAL HYSTERECTOMY      Social History   Socioeconomic History   Marital status: Single    Spouse name: Not on file   Number of children: Not on file   Years of education: Not on file   Highest education level: Not on file  Occupational History   Not on file  Tobacco Use   Smoking status: Heavy Smoker    Packs/day: 1    Types: Cigarettes   Smokeless tobacco: Never  Substance and Sexual Activity   Alcohol use: No   Drug use: Not on file   Sexual activity: Not on file  Other Topics Concern   Not on file  Social History Narrative   Not on file   Social Determinants of Health   Financial Resource Strain: Not on file  Food Insecurity: Not on file  Transportation Needs: Not on file  Physical Activity: Not on file  Stress: Not on file  Social Connections: Not on file  Intimate Partner Violence: Not on file    History reviewed. No pertinent family history.  Allergies  Allergen Reactions   Duloxetine Other (See Comments)    Dehydration, muscle contraction in hands, vomit  Other reaction(s): Unknown  Dehydration, muscle  contraction in hands, vomit    Other reaction(s): Unknown Dehydration, muscle contraction in hands, vomit    Dehydration, muscle contraction in hands, vomit Other reaction(s): Unknown Dehydration, muscle contraction in hands, vomit    Review of Systems  Neurological:  Positive for headaches.  All other systems reviewed and are negative.      Objective:   BP 110/70   Pulse 60   Ht 5' (1.524 m)   Wt 93 lb 6.4 oz (42.4 kg)   SpO2 98%   BMI 18.24 kg/m   Vitals:   12/28/22 1257  BP: 110/70  Pulse: 60  Height: 5' (1.524 m)  Weight: 93 lb 6.4 oz (42.4 kg)  SpO2: 98%  BMI (Calculated): 18.24    Physical Exam Vitals and nursing note reviewed.  Constitutional:      Appearance: Normal appearance. She is normal weight.  HENT:     Head: Normocephalic.  Eyes:     Pupils: Pupils are equal, round, and reactive to light.  Cardiovascular:     Rate and Rhythm: Normal rate.  Pulmonary:     Effort: Pulmonary effort is normal.  Neurological:     General: No focal deficit present.     Mental Status: She is alert and  oriented to person, place, and time. Mental status is at baseline.  Psychiatric:        Mood and Affect: Mood normal.        Behavior: Behavior normal.        Thought Content: Thought content normal.        Judgment: Judgment normal.      No results found for any visits on 12/28/22.     Assessment & Plan:   Problem List Items Addressed This Visit   None Visit Diagnoses     Screening mammogram for breast cancer    -  Primary   Relevant Orders   MM 3D SCREENING MAMMOGRAM BILATERAL BREAST       Return already scheduled, for F/U.   Total time spent: 20 minutes  Miki Kins, FNP  12/28/2022

## 2023-01-02 ENCOUNTER — Other Ambulatory Visit: Payer: Self-pay | Admitting: Family

## 2023-01-04 ENCOUNTER — Ambulatory Visit: Payer: 59

## 2023-01-10 ENCOUNTER — Ambulatory Visit (INDEPENDENT_AMBULATORY_CARE_PROVIDER_SITE_OTHER): Payer: 59

## 2023-01-10 DIAGNOSIS — R079 Chest pain, unspecified: Secondary | ICD-10-CM

## 2023-01-10 MED ORDER — TECHNETIUM TC 99M SESTAMIBI GENERIC - CARDIOLITE
9.8000 | Freq: Once | INTRAVENOUS | Status: AC | PRN
  Administered 2023-01-10: 9.8 via INTRAVENOUS

## 2023-01-10 MED ORDER — TECHNETIUM TC 99M SESTAMIBI GENERIC - CARDIOLITE
32.8000 | Freq: Once | INTRAVENOUS | Status: AC | PRN
  Administered 2023-01-10: 32.8 via INTRAVENOUS

## 2023-01-11 ENCOUNTER — Ambulatory Visit: Payer: 59

## 2023-01-11 DIAGNOSIS — R002 Palpitations: Secondary | ICD-10-CM

## 2023-01-11 DIAGNOSIS — I340 Nonrheumatic mitral (valve) insufficiency: Secondary | ICD-10-CM

## 2023-01-16 ENCOUNTER — Other Ambulatory Visit: Payer: Self-pay | Admitting: Specialist

## 2023-01-16 DIAGNOSIS — G43711 Chronic migraine without aura, intractable, with status migrainosus: Secondary | ICD-10-CM

## 2023-01-18 ENCOUNTER — Encounter: Payer: Self-pay | Admitting: Cardiovascular Disease

## 2023-01-18 ENCOUNTER — Ambulatory Visit: Payer: 59 | Admitting: Cardiovascular Disease

## 2023-01-18 VITALS — BP 110/78 | HR 70 | Ht 60.0 in | Wt 94.4 lb

## 2023-01-18 DIAGNOSIS — R0789 Other chest pain: Secondary | ICD-10-CM

## 2023-01-18 DIAGNOSIS — R079 Chest pain, unspecified: Secondary | ICD-10-CM | POA: Diagnosis not present

## 2023-01-18 DIAGNOSIS — R002 Palpitations: Secondary | ICD-10-CM

## 2023-01-18 DIAGNOSIS — F17209 Nicotine dependence, unspecified, with unspecified nicotine-induced disorders: Secondary | ICD-10-CM | POA: Diagnosis not present

## 2023-01-18 DIAGNOSIS — I495 Sick sinus syndrome: Secondary | ICD-10-CM

## 2023-01-18 DIAGNOSIS — E782 Mixed hyperlipidemia: Secondary | ICD-10-CM

## 2023-01-18 NOTE — Assessment & Plan Note (Signed)
No arrhythmias noted on holter monitor.

## 2023-01-18 NOTE — Assessment & Plan Note (Signed)
Holter monitor HR 36-136 bpm, average HR 72 bpm. No malignant arrhythmias noted. Bradycardic episodes correspond with patient sleeping.

## 2023-01-18 NOTE — Progress Notes (Signed)
Cardiology Office Note   Date:  01/18/2023   ID:  Sandra Reeves, DOB [birth year], MRN [id number]  PCP:  Miki Kins, FNP  Cardiologist:  Adrian Blackwater, MD      History of Present Illness: Sandra Reeves is a 47 y.o. female who presents for  Chief Complaint  Patient presents with   Follow-up    1 month follow up, NST and Echo results.    Patient in office to discuss results of stress test, echo, holter monitor. Continues to have intermittent chest pain, shortness of breath, dizziness, palpitations, fatigue.     Past Medical History:  Diagnosis Date   Osteoporosis      Past Surgical History:  Procedure Laterality Date   ABDOMINAL HYSTERECTOMY       Current Outpatient Medications  Medication Sig Dispense Refill   baclofen (LIORESAL) 10 MG tablet TAKE 1 TABLET BY MOUTH THREE TIMES A DAY AS NEEDED LOWER BACK PAIN 270 tablet 1   buPROPion (WELLBUTRIN XL) 150 MG 24 hr tablet Take 150 mg by mouth daily.     Fremanezumab-vfrm (AJOVY) 225 MG/1.5ML SOAJ Inject into the skin.     omeprazole (PRILOSEC) 40 MG capsule TAKE 1 CAPSULE (40 MG TOTAL) BY MOUTH DAILY. 90 capsule 1   pregabalin (LYRICA) 100 MG capsule Take 100 mg by mouth 3 (three) times daily.     Rimegepant Sulfate (NURTEC) 75 MG TBDP Take 1 tablet (75 mg total) by mouth as needed (for migraine.  Maximum of 1 per day.). 16 tablet 0   traMADol (ULTRAM) 50 MG tablet Take 50 mg by mouth every 6 (six) hours as needed for moderate pain or severe pain.     UBRELVY 100 MG TABS Take 1 tablet by mouth as needed (migraine.  May repeat in 2 hours if needed.).     No current facility-administered medications for this visit.    Allergies:   Duloxetine    Social History:   reports that she has been smoking cigarettes. She has been smoking an average of 1 pack per day. She has never used smokeless tobacco. She reports that she does not drink alcohol. No history on file for drug use.   Family History:  family history is  not on file.    ROS:     Review of Systems  Constitutional:  Positive for malaise/fatigue.  HENT: Negative.    Eyes: Negative.   Respiratory:  Positive for shortness of breath.   Cardiovascular:  Positive for chest pain and palpitations.  Gastrointestinal: Negative.   Genitourinary: Negative.   Musculoskeletal: Negative.   Skin: Negative.   Neurological:  Positive for dizziness.  Endo/Heme/Allergies: Negative.   Psychiatric/Behavioral: Negative.    All other systems reviewed and are negative.   All other systems are reviewed and negative.   PHYSICAL EXAM: VS:  BP 110/78   Pulse 70   Ht 5' (1.524 m)   Wt 94 lb 6.4 oz (42.8 kg)   SpO2 98%   BMI 18.44 kg/m  , BMI Body mass index is 18.44 kg/m. Last weight:  Wt Readings from Last 3 Encounters:  01/18/23 94 lb 6.4 oz (42.8 kg)  12/28/22 93 lb 6.4 oz (42.4 kg)  12/19/22 94 lb 12.8 oz (43 kg)    Physical Exam Constitutional:      Appearance: Normal appearance.  Cardiovascular:     Rate and Rhythm: Normal rate and regular rhythm.     Heart sounds: Normal heart sounds.  Pulmonary:  Effort: Pulmonary effort is normal.     Breath sounds: Normal breath sounds.  Musculoskeletal:     Right lower leg: No edema.     Left lower leg: No edema.  Neurological:     Mental Status: She is alert.     EKG: none today  Recent Labs: 10/30/2022: ALT 12; BUN 16; Creatinine, Ser 0.82; Hemoglobin 15.2; Potassium 4.3; Sodium 138; TSH 0.535    Lipid Panel    Component Value Date/Time   CHOL 221 (H) 10/30/2022 1405   TRIG 88 10/30/2022 1405   HDL 64 10/30/2022 1405   CHOLHDL 3.5 10/30/2022 1405   LDLCALC 142 (H) 10/30/2022 1405     Other studies Reviewed: stress test, echo, holter monitor   ASSESSMENT AND PLAN:    ICD-10-CM   1. Chest pain, unspecified type  R07.9     2. Mixed hyperlipidemia  E78.2 CT CORONARY MORPH W/CTA COR W/SCORE W/CA W/CM &/OR WO/CM    3. Palpitations  R00.2     4. Tobacco use disorder,  continuous  F17.209 CT CORONARY MORPH W/CTA COR W/SCORE W/CA W/CM &/OR WO/CM    5. Tachy-brady syndrome (HCC)  I49.5     6. Other chest pain  R07.89 CT CORONARY MORPH W/CTA COR W/SCORE W/CA W/CM &/OR WO/CM       Problem List Items Addressed This Visit       Cardiovascular and Mediastinum   Tachy-brady syndrome (HCC)    Holter monitor HR 36-136 bpm, average HR 72 bpm. No malignant arrhythmias noted. Bradycardic episodes correspond with patient sleeping.         Other   Tobacco use disorder, continuous    Smoking cessation instruction/counseling given:  counseled patient on the dangers of tobacco use, advised patient to stop smoking, and reviewed strategies to maximize success       Relevant Orders   CT CORONARY MORPH W/CTA COR W/SCORE W/CA W/CM &/OR WO/CM   Mixed hyperlipidemia   Relevant Orders   CT CORONARY MORPH W/CTA COR W/SCORE W/CA W/CM &/OR WO/CM   Palpitations    No arrhythmias noted on holter monitor.       Chest pain - Primary    Stress test 12/2022 Small mild reversible basal and mid anteroseptal and inferoseptal wall defects. Patient complains of chest pain, shortness of breath, dizziness, palpitations. Will order CCTA to check for blockages.       Relevant Orders   CT CORONARY MORPH W/CTA COR W/SCORE W/CA W/CM &/OR WO/CM     Disposition:   Return in about 4 weeks (around 02/15/2023) for after CCTA.    Total time spent: 30 minutes  Signed,  Adrian Blackwater, MD  01/18/2023 10:28 AM    [facility]

## 2023-01-18 NOTE — Assessment & Plan Note (Signed)
Smoking cessation instruction/counseling given:  counseled patient on the dangers of tobacco use, advised patient to stop smoking, and reviewed strategies to maximize success 

## 2023-01-18 NOTE — Assessment & Plan Note (Signed)
Stress test 12/2022 Small mild reversible basal and mid anteroseptal and inferoseptal wall defects. Patient complains of chest pain, shortness of breath, dizziness, palpitations. Will order CCTA to check for blockages.

## 2023-01-24 ENCOUNTER — Ambulatory Visit (INDEPENDENT_AMBULATORY_CARE_PROVIDER_SITE_OTHER): Payer: 59

## 2023-01-24 DIAGNOSIS — R072 Precordial pain: Secondary | ICD-10-CM | POA: Diagnosis not present

## 2023-01-24 DIAGNOSIS — E782 Mixed hyperlipidemia: Secondary | ICD-10-CM

## 2023-01-24 DIAGNOSIS — R0789 Other chest pain: Secondary | ICD-10-CM

## 2023-01-24 DIAGNOSIS — F17209 Nicotine dependence, unspecified, with unspecified nicotine-induced disorders: Secondary | ICD-10-CM

## 2023-01-24 MED ORDER — IOHEXOL 350 MG/ML SOLN
100.0000 mL | Freq: Once | INTRAVENOUS | Status: AC | PRN
  Administered 2023-01-24: 100 mL via INTRAVENOUS

## 2023-01-26 ENCOUNTER — Ambulatory Visit: Payer: Medicaid Other

## 2023-02-02 ENCOUNTER — Ambulatory Visit
Admission: RE | Admit: 2023-02-02 | Discharge: 2023-02-02 | Disposition: A | Discharge status: Home / Self Care | Payer: Medicaid Other | Source: Ambulatory Visit | Attending: Specialist | Admitting: Specialist

## 2023-02-02 DIAGNOSIS — G43711 Chronic migraine without aura, intractable, with status migrainosus: Secondary | ICD-10-CM | POA: Insufficient documentation

## 2023-02-14 ENCOUNTER — Other Ambulatory Visit: Payer: Self-pay | Admitting: Family

## 2023-02-14 DIAGNOSIS — F32A Depression, unspecified: Secondary | ICD-10-CM

## 2023-02-15 ENCOUNTER — Ambulatory Visit (INDEPENDENT_AMBULATORY_CARE_PROVIDER_SITE_OTHER): Payer: 59 | Admitting: Cardiovascular Disease

## 2023-02-15 ENCOUNTER — Encounter: Payer: Self-pay | Admitting: Cardiovascular Disease

## 2023-02-15 VITALS — BP 118/78 | HR 61 | Ht 60.0 in | Wt 96.6 lb

## 2023-02-15 DIAGNOSIS — F17209 Nicotine dependence, unspecified, with unspecified nicotine-induced disorders: Secondary | ICD-10-CM | POA: Diagnosis not present

## 2023-02-15 DIAGNOSIS — E782 Mixed hyperlipidemia: Secondary | ICD-10-CM

## 2023-02-15 DIAGNOSIS — R0789 Other chest pain: Secondary | ICD-10-CM | POA: Diagnosis not present

## 2023-02-15 DIAGNOSIS — R002 Palpitations: Secondary | ICD-10-CM

## 2023-02-15 NOTE — Progress Notes (Signed)
Cardiology Office Note   Date:  02/15/2023   ID:  Sandra Reeves, DOB June 01, 1976, MRN [id number]  PCP:  Miki Kins, FNP  Cardiologist:  Adrian Blackwater, MD      History of Present Illness: Sandra Reeves is a 47 y.o. female who presents for  Chief Complaint  Patient presents with   Follow-up    CCTA Results    Chest Pain  This is a chronic problem. The current episode started 1 to 4 weeks ago. The problem has been resolved.      Past Medical History:  Diagnosis Date   Osteoporosis      Past Surgical History:  Procedure Laterality Date   ABDOMINAL HYSTERECTOMY       Current Outpatient Medications  Medication Sig Dispense Refill   baclofen (LIORESAL) 10 MG tablet TAKE 1 TABLET BY MOUTH THREE TIMES A DAY AS NEEDED LOWER BACK PAIN 270 tablet 1   buPROPion (WELLBUTRIN XL) 150 MG 24 hr tablet Take 150 mg by mouth daily.     Fremanezumab-vfrm (AJOVY) 225 MG/1.5ML SOAJ Inject into the skin.     omeprazole (PRILOSEC) 40 MG capsule TAKE 1 CAPSULE (40 MG TOTAL) BY MOUTH DAILY. 90 capsule 1   pregabalin (LYRICA) 100 MG capsule Take 100 mg by mouth 3 (three) times daily.     Rimegepant Sulfate (NURTEC) 75 MG TBDP Take 1 tablet (75 mg total) by mouth as needed (for migraine.  Maximum of 1 per day.). 16 tablet 0   traMADol (ULTRAM) 50 MG tablet Take 50 mg by mouth every 6 (six) hours as needed for moderate pain or severe pain.     UBRELVY 100 MG TABS Take 1 tablet by mouth as needed (migraine.  May repeat in 2 hours if needed.).     No current facility-administered medications for this visit.    Allergies:   Duloxetine    Social History:   reports that she has been smoking cigarettes. She has been smoking an average of 1 pack per day. She has never used smokeless tobacco. She reports that she does not drink alcohol. No history on file for drug use.   Family History:  family history is not on file.    ROS:     Review of Systems  Constitutional: Negative.    HENT: Negative.    Eyes: Negative.   Respiratory: Negative.    Cardiovascular:  Positive for chest pain.  Gastrointestinal: Negative.   Genitourinary: Negative.   Musculoskeletal: Negative.   Skin: Negative.   Neurological: Negative.   Endo/Heme/Allergies: Negative.   Psychiatric/Behavioral: Negative.    All other systems reviewed and are negative.     All other systems are reviewed and negative.    PHYSICAL EXAM: VS:  BP 118/78   Pulse 61   Ht 5' (1.524 m)   Wt 96 lb 9.6 oz (43.8 kg)   SpO2 96%   BMI 18.87 kg/m  , BMI Body mass index is 18.87 kg/m. Last weight:  Wt Readings from Last 3 Encounters:  02/15/23 96 lb 9.6 oz (43.8 kg)  01/18/23 94 lb 6.4 oz (42.8 kg)  12/28/22 93 lb 6.4 oz (42.4 kg)     Physical Exam Constitutional:      Appearance: Normal appearance.  Cardiovascular:     Rate and Rhythm: Normal rate and regular rhythm.     Heart sounds: Normal heart sounds.  Pulmonary:     Effort: Pulmonary effort is normal.     Breath  sounds: Normal breath sounds.  Musculoskeletal:     Right lower leg: No edema.     Left lower leg: No edema.  Neurological:     Mental Status: She is alert.       EKG:   Recent Labs: 10/30/2022: ALT 12; BUN 16; Creatinine, Ser 0.82; Hemoglobin 15.2; Potassium 4.3; Sodium 138; TSH 0.535    Lipid Panel    Component Value Date/Time   CHOL 221 (H) 10/30/2022 1405   TRIG 88 10/30/2022 1405   HDL 64 10/30/2022 1405   CHOLHDL 3.5 10/30/2022 1405   LDLCALC 142 (H) 10/30/2022 1405      Other studies Reviewed: Additional studies/ records that were reviewed today include:  Review of the above records demonstrates:       No data to display            ASSESSMENT AND PLAN:    ICD-10-CM   1. Tobacco use disorder, continuous  F17.209     2. Palpitations  R00.2     3. Mixed hyperlipidemia  E78.2     4. Chest pain, non-cardiac  R07.89    CCTA showed normal coronaries and ca score 0. Reassured patient. Most likely  GERD causing chest pain       Problem List Items Addressed This Visit       Other   Tobacco use disorder, continuous - Primary   Mixed hyperlipidemia   Palpitations   Other Visit Diagnoses     Chest pain, non-cardiac       CCTA showed normal coronaries and ca score 0. Reassured patient. Most likely GERD causing chest pain          Disposition:   Return in about 3 months (around 05/18/2023).    Total time spent: 30 minutes  Signed,  Adrian Blackwater, MD  02/15/2023 11:06 AM    [facility]

## 2023-03-27 ENCOUNTER — Ambulatory Visit: Payer: 59 | Admitting: Family

## 2023-04-02 ENCOUNTER — Telehealth: Payer: Self-pay | Admitting: Family

## 2023-04-02 ENCOUNTER — Encounter: Payer: Self-pay | Admitting: Family

## 2023-04-02 ENCOUNTER — Ambulatory Visit (INDEPENDENT_AMBULATORY_CARE_PROVIDER_SITE_OTHER): Payer: 59 | Admitting: Family

## 2023-04-02 VITALS — BP 120/79 | HR 76 | Ht 60.0 in | Wt 93.8 lb

## 2023-04-02 DIAGNOSIS — E782 Mixed hyperlipidemia: Secondary | ICD-10-CM | POA: Diagnosis not present

## 2023-04-02 DIAGNOSIS — M545 Low back pain, unspecified: Secondary | ICD-10-CM

## 2023-04-02 DIAGNOSIS — J449 Chronic obstructive pulmonary disease, unspecified: Secondary | ICD-10-CM | POA: Diagnosis not present

## 2023-04-02 DIAGNOSIS — G8929 Other chronic pain: Secondary | ICD-10-CM

## 2023-04-02 NOTE — Progress Notes (Signed)
Established Patient Office Visit  Subjective:  Patient ID: Sandra Reeves, female    DOB: 01/29/1976  Age: 47 y.o. MRN: [id number]  Chief Complaint  Patient presents with   Follow-up    3 mo F/U    Patient is here today for her 3 months follow up.  She has been feeling fairly well since last appointment.   She does have additional concerns to discuss today.  Labs are due today. She needs refills.   I have reviewed her active problem list, medication list, allergies, notes from last encounter, lab results for her appointment today.      No other concerns at this time.   Past Medical History:  Diagnosis Date   Osteoporosis     Past Surgical History:  Procedure Laterality Date   ABDOMINAL HYSTERECTOMY      Social History   Socioeconomic History   Marital status: Single    Spouse name: Not on file   Number of children: Not on file   Years of education: Not on file   Highest education level: Not on file  Occupational History   Not on file  Tobacco Use   Smoking status: Heavy Smoker    Current packs/day: 1.00    Types: Cigarettes   Smokeless tobacco: Never  Substance and Sexual Activity   Alcohol use: No   Drug use: Not Currently   Sexual activity: Not on file  Other Topics Concern   Not on file  Social History Narrative   Not on file   Social Determinants of Health   Financial Resource Strain: Not on file  Food Insecurity: Not on file  Transportation Needs: Not on file  Physical Activity: Not on file  Stress: Not on file  Social Connections: Not on file  Intimate Partner Violence: Not on file    History reviewed. No pertinent family history.  Allergies  Allergen Reactions   Duloxetine Other (See Comments)    Dehydration, muscle contraction in hands, vomit  Other reaction(s): Unknown  Dehydration, muscle contraction in hands, vomit    Other reaction(s): Unknown Dehydration, muscle contraction in hands, vomit    Dehydration, muscle  contraction in hands, vomit Other reaction(s): Unknown Dehydration, muscle contraction in hands, vomit    Review of Systems  Musculoskeletal:  Positive for back pain and myalgias.  Neurological:  Positive for tingling and sensory change.  Psychiatric/Behavioral:  The patient is nervous/anxious.   All other systems reviewed and are negative.      Objective:   BP 120/79   Pulse 76   Ht 5' (1.524 m)   Wt 93 lb 12.8 oz (42.5 kg)   SpO2 95%   BMI 18.32 kg/m   Vitals:   04/02/23 0955  BP: 120/79  Pulse: 76  Height: 5' (1.524 m)  Weight: 93 lb 12.8 oz (42.5 kg)  SpO2: 95%  BMI (Calculated): 18.32    Physical Exam Vitals and nursing note reviewed.  Constitutional:      Appearance: Normal appearance. She is normal weight.  HENT:     Head: Normocephalic.  Eyes:     Pupils: Pupils are equal, round, and reactive to light.  Cardiovascular:     Rate and Rhythm: Normal rate.  Pulmonary:     Effort: Pulmonary effort is normal.  Musculoskeletal:     Thoracic back: Tenderness present.     Lumbar back: Spasms and tenderness present. Decreased range of motion. Positive right straight leg raise test and positive left straight leg raise  test.  Neurological:     General: No focal deficit present.     Mental Status: She is alert and oriented to person, place, and time.     Gait: Gait abnormal (limping on right side).  Psychiatric:        Mood and Affect: Mood normal.        Behavior: Behavior normal.        Thought Content: Thought content normal.        Judgment: Judgment normal.      No results found for any visits on 04/02/23.  No results found for this or any previous visit (from the past 2160 hour(s)).     Assessment & Plan:   Problem List Items Addressed This Visit       Active Problems   Chronic bilateral low back pain without sciatica    Patient stable.  Well controlled with current therapy.   Continue current meds.       Chronic obstructive pulmonary  disease (HCC) - Primary    Patient stable.  Well controlled with current therapy.   Continue current meds.       Mixed hyperlipidemia    Checking labs today.  Continue current therapy for lipid control. Will modify as needed based on labwork results.         Return in about 3 months (around 07/03/2023).   Total time spent: 20 minutes  Miki Kins, FNP  04/02/2023   This document may have been prepared by [facility] Voice Recognition software and as such may include unintentional dictation errors.

## 2023-04-02 NOTE — Telephone Encounter (Signed)
Patient called in asking if Sandra Reeves can order a CT chest because she is having chest pain. States x-ray has not shown anything.   She also requests a copy of her medical records.

## 2023-04-06 ENCOUNTER — Encounter: Payer: Self-pay | Admitting: Family

## 2023-04-06 NOTE — Assessment & Plan Note (Signed)
Checking labs today.  Continue current therapy for lipid control. Will modify as needed based on labwork results.  

## 2023-04-06 NOTE — Assessment & Plan Note (Signed)
Patient stable.  Well controlled with current therapy.   Continue current meds.  

## 2023-04-08 ENCOUNTER — Other Ambulatory Visit: Payer: Self-pay

## 2023-04-08 ENCOUNTER — Emergency Department: Payer: 59

## 2023-04-08 ENCOUNTER — Emergency Department
Admission: EM | Admit: 2023-04-08 | Discharge: 2023-04-08 | Disposition: A | Discharge status: Home / Self Care | Payer: 59 | Attending: Emergency Medicine | Admitting: Emergency Medicine

## 2023-04-08 DIAGNOSIS — R1084 Generalized abdominal pain: Secondary | ICD-10-CM | POA: Diagnosis present

## 2023-04-08 DIAGNOSIS — R0781 Pleurodynia: Secondary | ICD-10-CM | POA: Insufficient documentation

## 2023-04-08 DIAGNOSIS — D735 Infarction of spleen: Secondary | ICD-10-CM | POA: Diagnosis not present

## 2023-04-08 DIAGNOSIS — W19XXXA Unspecified fall, initial encounter: Secondary | ICD-10-CM | POA: Diagnosis not present

## 2023-04-08 DIAGNOSIS — R112 Nausea with vomiting, unspecified: Secondary | ICD-10-CM | POA: Insufficient documentation

## 2023-04-08 LAB — COMPREHENSIVE METABOLIC PANEL
ALT: 11 U/L (ref 0–44)
AST: 16 U/L (ref 15–41)
Albumin: 4.2 g/dL (ref 3.5–5.0)
Alkaline Phosphatase: 124 U/L (ref 38–126)
Anion gap: 6 (ref 5–15)
BUN: 12 mg/dL (ref 6–20)
CO2: 29 mmol/L (ref 22–32)
Calcium: 9.2 mg/dL (ref 8.9–10.3)
Chloride: 104 mmol/L (ref 98–111)
Creatinine, Ser: 0.87 mg/dL (ref 0.44–1.00)
GFR, Estimated: >60 mL/min (ref >60)
Glucose, Bld: 97 mg/dL (ref 70–99)
Potassium: 3.6 mmol/L (ref 3.5–5.1)
Sodium: 139 mmol/L (ref 135–145)
Total Bilirubin: 0.3 mg/dL (ref 0.3–1.2)
Total Protein: 7 g/dL (ref 6.5–8.1)

## 2023-04-08 LAB — CBC WITH DIFFERENTIAL/PLATELET
Abs Immature Granulocytes: 0.08 10*3/uL — ABNORMAL HIGH (ref 0.00–0.07)
Basophils Absolute: 0 10*3/uL (ref 0.0–0.1)
Basophils Relative: 0 %
Eosinophils Absolute: 0.2 10*3/uL (ref 0.0–0.5)
Eosinophils Relative: 1 %
HCT: 42.2 % (ref 36.0–46.0)
Hemoglobin: 14.4 g/dL (ref 12.0–15.0)
Immature Granulocytes: 1 %
Lymphocytes Relative: 30 %
Lymphs Abs: 4.3 10*3/uL — ABNORMAL HIGH (ref 0.7–4.0)
MCH: 31 pg (ref 26.0–34.0)
MCHC: 34.1 g/dL (ref 30.0–36.0)
MCV: 90.8 fL (ref 80.0–100.0)
Monocytes Absolute: 1.6 10*3/uL — ABNORMAL HIGH (ref 0.1–1.0)
Monocytes Relative: 11 %
Neutro Abs: 8 10*3/uL — ABNORMAL HIGH (ref 1.7–7.7)
Neutrophils Relative %: 57 %
Platelets: 416 10*3/uL — ABNORMAL HIGH (ref 150–400)
RBC: 4.65 MIL/uL (ref 3.87–5.11)
RDW: 12.1 % (ref 11.5–15.5)
WBC: 14.1 10*3/uL — ABNORMAL HIGH (ref 4.0–10.5)
nRBC: 0 % (ref 0.0–0.2)

## 2023-04-08 LAB — URINALYSIS, ROUTINE W REFLEX MICROSCOPIC
Bilirubin Urine: NEGATIVE
Glucose, UA: NEGATIVE mg/dL
Hgb urine dipstick: NEGATIVE
Ketones, ur: NEGATIVE mg/dL
Leukocytes,Ua: NEGATIVE
Nitrite: NEGATIVE
Protein, ur: NEGATIVE mg/dL
Specific Gravity, Urine: 1.001 — ABNORMAL LOW (ref 1.005–1.030)
pH: 8 (ref 5.0–8.0)

## 2023-04-08 LAB — LIPASE, BLOOD: Lipase: 35 U/L (ref 11–51)

## 2023-04-08 LAB — TROPONIN I (HIGH SENSITIVITY)
Troponin I (High Sensitivity): 4 ng/L (ref <18)
Troponin I (High Sensitivity): 3 ng/L (ref <18)

## 2023-04-08 MED ORDER — IOHEXOL 350 MG/ML SOLN
75.0000 mL | Freq: Once | INTRAVENOUS | Status: AC | PRN
  Administered 2023-04-08: 75 mL via INTRAVENOUS

## 2023-04-08 MED ORDER — MELOXICAM 15 MG PO TABS: 15.0000 mg | ORAL_TABLET | Freq: Every day | ORAL | 0 refills | Status: AC

## 2023-04-08 MED ORDER — HYDROCODONE-ACETAMINOPHEN 5-325 MG PO TABS: 1.0000 | ORAL_TABLET | ORAL | 0 refills | Status: AC | PRN

## 2023-04-08 MED ORDER — SODIUM CHLORIDE 0.9 % IV BOLUS
1000.0000 mL | Freq: Once | INTRAVENOUS | Status: AC
  Administered 2023-04-08: 1000 mL via INTRAVENOUS

## 2023-04-08 MED ORDER — ONDANSETRON 8 MG PO TBDP
8.0000 mg | ORAL_TABLET | Freq: Once | ORAL | Status: AC
  Administered 2023-04-08: 8 mg via ORAL
  Filled 2023-04-08: qty 1

## 2023-04-08 MED ORDER — ONDANSETRON 4 MG PO TBDP: 4.0000 mg | ORAL_TABLET | Freq: Three times a day (TID) | ORAL | 0 refills | Status: DC | PRN

## 2023-04-08 MED ORDER — HYDROCODONE-ACETAMINOPHEN 5-325 MG PO TABS
1.0000 | ORAL_TABLET | Freq: Once | ORAL | Status: AC
  Administered 2023-04-08: 1 via ORAL
  Filled 2023-04-08: qty 1

## 2023-04-08 MED ORDER — MELOXICAM 7.5 MG PO TABS
15.0000 mg | ORAL_TABLET | Freq: Once | ORAL | Status: AC
  Administered 2023-04-08: 15 mg via ORAL
  Filled 2023-04-08: qty 2

## 2023-04-08 MED ORDER — IOHEXOL 300 MG/ML  SOLN
75.0000 mL | Freq: Once | INTRAMUSCULAR | Status: AC | PRN
  Administered 2023-04-08: 75 mL via INTRAVENOUS

## 2023-04-08 NOTE — ED Notes (Signed)
Pt ambulated to bathroom with steady gait. 

## 2023-04-08 NOTE — ED Notes (Signed)
Pt to CT
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
## 2023-04-08 NOTE — ED Notes (Signed)
Pt at X-ray

## 2023-04-10 ENCOUNTER — Telehealth: Payer: Self-pay | Admitting: Family

## 2023-04-10 NOTE — Telephone Encounter (Signed)
Patient called in and states she just got home from the hospital and she had a splenic tear. Her WBC were very elevated and she is having pain. It hurts to eat or really do anything. Any advice on what she should do?   Went home with hydrocodone, meloxicam, and Zofran. Has not picked up hydrocodone yet because they were out of stock.   She is mainly concerned about the WBC. She didn't start the methylprednisolone dose pack yet. Should she start that now?

## 2023-04-11 MED ORDER — OXYCODONE-ACETAMINOPHEN 5-325 MG PO TABS: 1.0000 | ORAL_TABLET | ORAL | 0 refills | Status: AC | PRN

## 2023-04-11 NOTE — Telephone Encounter (Signed)
Switched vicodin to [employer]

## 2023-04-26 ENCOUNTER — Ambulatory Visit: Payer: 59 | Admitting: Family

## 2023-04-30 ENCOUNTER — Ambulatory Visit: Payer: Medicaid Other | Admitting: Family

## 2023-04-30 VITALS — BP 110/60 | HR 72 | Ht 60.0 in | Wt 97.6 lb

## 2023-04-30 DIAGNOSIS — G8929 Other chronic pain: Secondary | ICD-10-CM | POA: Diagnosis not present

## 2023-04-30 DIAGNOSIS — J449 Chronic obstructive pulmonary disease, unspecified: Secondary | ICD-10-CM

## 2023-04-30 DIAGNOSIS — M545 Low back pain, unspecified: Secondary | ICD-10-CM | POA: Diagnosis not present

## 2023-04-30 DIAGNOSIS — E782 Mixed hyperlipidemia: Secondary | ICD-10-CM

## 2023-04-30 NOTE — Progress Notes (Signed)
Established Patient Office Visit  Subjective:  Patient ID: Sandra Reeves, female    DOB: Aug 30, 1976  Age: 47 y.o. MRN: [id number]  Chief Complaint  Patient presents with   Follow-up    Hospital f/u    Patient is here today for her hospital follow up.  She has been feeling poorly since last appointment.   She does have additional concerns to discuss today.  She has a small lump next to her belly button. Not painful, just noticed it and was wondering what it was.   Dr. Estella Husk - wrote a letter for her, but did not mention the repetitive motion injury. She asks if we can fix this.   Labs are not due today. She needs refills.   I have reviewed her active problem list, medication list, allergies, notes from last encounter, lab results for her appointment today.      No other concerns at this time.   Past Medical History:  Diagnosis Date   Chest pain 12/19/2022   Osteoporosis     Past Surgical History:  Procedure Laterality Date   ABDOMINAL HYSTERECTOMY      Social History   Socioeconomic History   Marital status: Single    Spouse name: Not on file   Number of children: Not on file   Years of education: Not on file   Highest education level: Not on file  Occupational History   Not on file  Tobacco Use   Smoking status: Heavy Smoker    Current packs/day: 1.00    Types: Cigarettes   Smokeless tobacco: Never  Substance and Sexual Activity   Alcohol use: No   Drug use: Not Currently   Sexual activity: Not on file  Other Topics Concern   Not on file  Social History Narrative   Not on file   Social Determinants of Health   Financial Resource Strain: Not on file  Food Insecurity: Not on file  Transportation Needs: Not on file  Physical Activity: Not on file  Stress: Not on file  Social Connections: Not on file  Intimate Partner Violence: Not on file    History reviewed. No pertinent family history.  Allergies  Allergen Reactions   Duloxetine  Other (See Comments)    Dehydration, muscle contraction in hands, vomit  Other reaction(s): Unknown  Dehydration, muscle contraction in hands, vomit    Other reaction(s): Unknown Dehydration, muscle contraction in hands, vomit    Dehydration, muscle contraction in hands, vomit Other reaction(s): Unknown Dehydration, muscle contraction in hands, vomit    Review of Systems  All other systems reviewed and are negative.      Objective:   BP 110/60   Pulse 72   Ht 5' (1.524 m)   Wt 97 lb 9.6 oz (44.3 kg)   SpO2 98%   BMI 19.06 kg/m   Vitals:   04/30/23 1440  BP: 110/60  Pulse: 72  Height: 5' (1.524 m)  Weight: 97 lb 9.6 oz (44.3 kg)  SpO2: 98%  BMI (Calculated): 19.06    Physical Exam Vitals and nursing note reviewed.  Constitutional:      Appearance: Normal appearance. She is normal weight.  HENT:     Head: Normocephalic.  Eyes:     Pupils: Pupils are equal, round, and reactive to light.  Cardiovascular:     Rate and Rhythm: Normal rate.  Pulmonary:     Effort: Pulmonary effort is normal.  Musculoskeletal:     Thoracic back: Spasms and tenderness  present.     Lumbar back: Tenderness present. Positive right straight leg raise test and positive left straight leg raise test.  Skin:      Neurological:     General: No focal deficit present.     Mental Status: She is alert and oriented to person, place, and time.     Gait: Gait abnormal.  Psychiatric:        Attention and Perception: Attention and perception normal.        Mood and Affect: Mood normal.        Speech: Speech normal.        Behavior: Behavior normal. Behavior is cooperative.        Thought Content: Thought content normal.        Cognition and Memory: Cognition and memory normal.        Judgment: Judgment normal.      No results found for any visits on 04/30/23.  Recent Results (from the past 2160 hour(s))  Lipase, blood     Status: None   Collection Time: 04/08/23  3:08 PM  Result  Value Ref Range   Lipase 35 11 - 51 U/L    Comment: Performed at [facility], [address] [place], [place] [address]  Comprehensive metabolic panel     Status: None   Collection Time: 04/08/23  3:08 PM  Result Value Ref Range   Sodium 139 135 - 145 mmol/L   Potassium 3.6 3.5 - 5.1 mmol/L   Chloride 104 98 - 111 mmol/L   CO2 29 22 - 32 mmol/L   Glucose, Bld 97 70 - 99 mg/dL    Comment: Glucose reference range applies only to samples taken after fasting for at least 8 hours.   BUN 12 6 - 20 mg/dL   Creatinine, Ser 8.46 0.44 - 1.00 mg/dL   Calcium 9.2 8.9 - 96.2 mg/dL   Total Protein 7.0 6.5 - 8.1 g/dL   Albumin 4.2 3.5 - 5.0 g/dL   AST 16 15 - 41 U/L   ALT 11 0 - 44 U/L   Alkaline Phosphatase 124 38 - 126 U/L   Total Bilirubin 0.3 0.3 - 1.2 mg/dL   GFR, Estimated >95 >28 mL/min    Comment: (NOTE) Calculated using the CKD-EPI Creatinine Equation (2021)    Anion gap 6 5 - 15    Comment: Performed at [facility], [address] [place], [place] [address]  Urinalysis, Routine w reflex microscopic -Urine, Clean Catch     Status: Abnormal   Collection Time: 04/08/23  3:08 PM  Result Value Ref Range   Color, Urine COLORLESS (A) YELLOW   APPearance CLEAR (A) CLEAR   Specific Gravity, Urine 1.001 (L) 1.005 - 1.030   pH 8.0 5.0 - 8.0   Glucose, UA NEGATIVE NEGATIVE mg/dL   Hgb urine dipstick NEGATIVE NEGATIVE   Bilirubin Urine NEGATIVE NEGATIVE   Ketones, ur NEGATIVE NEGATIVE mg/dL   Protein, ur NEGATIVE NEGATIVE mg/dL   Nitrite NEGATIVE NEGATIVE   Leukocytes,Ua NEGATIVE NEGATIVE    Comment: Performed at [facility], [address] [place], [place] [address]  CBC with Differential     Status: Abnormal   Collection Time: 04/08/23  3:08 PM  Result Value Ref Range   WBC 14.1 (H) 4.0 - 10.5 K/uL   RBC 4.65 3.87 - 5.11 MIL/uL   Hemoglobin 14.4 12.0 - 15.0 g/dL   HCT 72.5 36.6 - 44.0 %   MCV 90.8 80.0 -  100.0 fL   MCH 31.0 26.0 - 34.0 pg   MCHC  34.1 30.0 - 36.0 g/dL   RDW 63.8 75.6 - 43.3 %   Platelets 416 (H) 150 - 400 K/uL   nRBC 0.0 0.0 - 0.2 %   Neutrophils Relative % 57 %   Neutro Abs 8.0 (H) 1.7 - 7.7 K/uL   Lymphocytes Relative 30 %   Lymphs Abs 4.3 (H) 0.7 - 4.0 K/uL   Monocytes Relative 11 %   Monocytes Absolute 1.6 (H) 0.1 - 1.0 K/uL   Eosinophils Relative 1 %   Eosinophils Absolute 0.2 0.0 - 0.5 K/uL   Basophils Relative 0 %   Basophils Absolute 0.0 0.0 - 0.1 K/uL   Immature Granulocytes 1 %   Abs Immature Granulocytes 0.08 (H) 0.00 - 0.07 K/uL    Comment: Performed at [facility], [address], [place], [place] [address]  Troponin I (High Sensitivity)     Status: None   Collection Time: 04/08/23  3:08 PM  Result Value Ref Range   Troponin I (High Sensitivity) 4 <18 ng/L    Comment: (NOTE) Elevated high sensitivity troponin I (hsTnI) values and significant  changes across serial measurements may suggest ACS but many other  chronic and acute conditions are known to elevate hsTnI results.  Refer to the "Links" section for chest pain algorithms and additional  guidance. Performed at [facility], [address] [place], [place] [address]   Troponin I (High Sensitivity)     Status: None   Collection Time: 04/08/23  5:43 PM  Result Value Ref Range   Troponin I (High Sensitivity) 3 <18 ng/L    Comment: (NOTE) Elevated high sensitivity troponin I (hsTnI) values and significant  changes across serial measurements may suggest ACS but many other  chronic and acute conditions are known to elevate hsTnI results.  Refer to the "Links" section for chest pain algorithms and additional  guidance. Performed at [facility], [address] [place], [place] [address]        Assessment & Plan:   Problem List Items Addressed This Visit       Active Problems   Chronic bilateral low back pain without sciatica - Primary    Suggested that pt should contact Dr. Estella Husk for updates to  the letter.  If he is unwilling or unable to make changes, I have asked her to let us know.       Relevant Medications   topiramate (TOPAMAX) 25 MG tablet   amitriptyline (ELAVIL) 25 MG tablet   Chronic obstructive pulmonary disease (HCC)    Patient stable.  Well controlled with current therapy.   Continue current meds.       Mixed hyperlipidemia    Checking labs today.  Continue current therapy for lipid control. Will modify as needed based on labwork results.        Return in about 2 months (around 06/30/2023).   Total time spent: 20 minutes  Miki Kins, FNP  04/30/2023   This document may have been prepared by [facility] Voice Recognition software and as such may include unintentional dictation errors.

## 2023-05-17 ENCOUNTER — Other Ambulatory Visit: Payer: Self-pay

## 2023-05-17 MED ORDER — ONDANSETRON 4 MG PO TBDP: 4.0000 mg | ORAL_TABLET | Freq: Three times a day (TID) | ORAL | 0 refills | Status: AC | PRN

## 2023-05-20 ENCOUNTER — Encounter: Payer: Self-pay | Admitting: Family

## 2023-05-20 NOTE — Assessment & Plan Note (Signed)
Patient stable.  Well controlled with current therapy.   Continue current meds.  

## 2023-05-20 NOTE — Assessment & Plan Note (Signed)
Checking labs today.  Continue current therapy for lipid control. Will modify as needed based on labwork results.  

## 2023-05-20 NOTE — Assessment & Plan Note (Signed)
Suggested that pt should contact Dr. Estella Husk for updates to the letter.  If he is unwilling or unable to make changes, I have asked her to let us know.

## 2023-05-31 ENCOUNTER — Ambulatory Visit: Payer: 59 | Admitting: Cardiology

## 2023-06-07 ENCOUNTER — Ambulatory Visit: Payer: 59 | Admitting: Cardiology

## 2023-07-03 ENCOUNTER — Ambulatory Visit: Payer: 59 | Admitting: Family

## 2024-01-08 ENCOUNTER — Telehealth: Payer: Self-pay | Admitting: Family

## 2024-01-08 NOTE — Telephone Encounter (Signed)
 Patient left VM wanting to know if it is in her chart that she has Afib? She is curious if she has this or not.   I do not see it in her problem list. Please advise.

## 2024-01-24 ENCOUNTER — Ambulatory Visit: Admitting: Family

## 2024-01-24 ENCOUNTER — Encounter: Payer: Self-pay | Admitting: Family

## 2024-01-24 VITALS — BP 112/80 | HR 65 | Ht 60.0 in | Wt 98.0 lb

## 2024-01-24 DIAGNOSIS — M5412 Radiculopathy, cervical region: Secondary | ICD-10-CM | POA: Insufficient documentation

## 2024-01-24 DIAGNOSIS — G43111 Migraine with aura, intractable, with status migrainosus: Secondary | ICD-10-CM | POA: Insufficient documentation

## 2024-01-24 DIAGNOSIS — G894 Chronic pain syndrome: Secondary | ICD-10-CM | POA: Insufficient documentation

## 2024-01-24 DIAGNOSIS — M5442 Lumbago with sciatica, left side: Secondary | ICD-10-CM

## 2024-01-24 DIAGNOSIS — G40209 Localization-related (focal) (partial) symptomatic epilepsy and epileptic syndromes with complex partial seizures, not intractable, without status epilepticus: Secondary | ICD-10-CM

## 2024-01-24 DIAGNOSIS — E782 Mixed hyperlipidemia: Secondary | ICD-10-CM | POA: Diagnosis not present

## 2024-01-24 DIAGNOSIS — R7303 Prediabetes: Secondary | ICD-10-CM | POA: Diagnosis not present

## 2024-01-24 DIAGNOSIS — G8929 Other chronic pain: Secondary | ICD-10-CM

## 2024-01-24 DIAGNOSIS — M5417 Radiculopathy, lumbosacral region: Secondary | ICD-10-CM | POA: Insufficient documentation

## 2024-01-24 DIAGNOSIS — E538 Deficiency of other specified B group vitamins: Secondary | ICD-10-CM

## 2024-01-24 DIAGNOSIS — E559 Vitamin D deficiency, unspecified: Secondary | ICD-10-CM | POA: Diagnosis not present

## 2024-01-24 DIAGNOSIS — I10 Essential (primary) hypertension: Secondary | ICD-10-CM | POA: Diagnosis not present

## 2024-01-24 DIAGNOSIS — G2581 Restless legs syndrome: Secondary | ICD-10-CM

## 2024-01-24 DIAGNOSIS — G5603 Carpal tunnel syndrome, bilateral upper limbs: Secondary | ICD-10-CM | POA: Insufficient documentation

## 2024-01-24 DIAGNOSIS — M5441 Lumbago with sciatica, right side: Secondary | ICD-10-CM

## 2024-01-24 DIAGNOSIS — E611 Iron deficiency: Secondary | ICD-10-CM

## 2024-01-24 DIAGNOSIS — R5383 Other fatigue: Secondary | ICD-10-CM

## 2024-01-25 LAB — LIPID PANEL
Chol/HDL Ratio: 3.8 ratio (ref 0.0–4.4)
Cholesterol, Total: 207 mg/dL — ABNORMAL HIGH (ref 100–199)
HDL: 54 mg/dL (ref >39)
LDL Chol Calc (NIH): 132 mg/dL — ABNORMAL HIGH (ref 0–99)
Triglycerides: 118 mg/dL (ref 0–149)
VLDL Cholesterol Cal: 21 mg/dL (ref 5–40)

## 2024-01-25 LAB — CMP14+EGFR
ALT: 7 IU/L (ref 0–32)
AST: 14 IU/L (ref 0–40)
Albumin: 4.7 g/dL (ref 3.9–4.9)
Alkaline Phosphatase: 173 IU/L — ABNORMAL HIGH (ref 44–121)
BUN/Creatinine Ratio: 9 (ref 9–23)
BUN: 8 mg/dL (ref 6–24)
Bilirubin Total: 0.4 mg/dL (ref 0.0–1.2)
CO2: 20 mmol/L (ref 20–29)
Calcium: 10.2 mg/dL (ref 8.7–10.2)
Chloride: 103 mmol/L (ref 96–106)
Creatinine, Ser: 0.85 mg/dL (ref 0.57–1.00)
Globulin, Total: 2.6 g/dL (ref 1.5–4.5)
Glucose: 87 mg/dL (ref 70–99)
Potassium: 3.9 mmol/L (ref 3.5–5.2)
Sodium: 142 mmol/L (ref 134–144)
Total Protein: 7.3 g/dL (ref 6.0–8.5)
eGFR: 86 mL/min/{1.73_m2} (ref >59)

## 2024-01-25 LAB — CBC WITH DIFFERENTIAL/PLATELET
Basophils Absolute: 0 10*3/uL (ref 0.0–0.2)
Basos: 1 %
EOS (ABSOLUTE): 0.2 10*3/uL (ref 0.0–0.4)
Eos: 2 %
Hematocrit: 43.7 % (ref 34.0–46.6)
Hemoglobin: 15.3 g/dL (ref 11.1–15.9)
Immature Grans (Abs): 0 10*3/uL (ref 0.0–0.1)
Immature Granulocytes: 0 %
Lymphocytes Absolute: 3.2 10*3/uL — ABNORMAL HIGH (ref 0.7–3.1)
Lymphs: 38 %
MCH: 32.2 pg (ref 26.6–33.0)
MCHC: 35 g/dL (ref 31.5–35.7)
MCV: 92 fL (ref 79–97)
Monocytes Absolute: 0.8 10*3/uL (ref 0.1–0.9)
Monocytes: 9 %
Neutrophils Absolute: 4.3 10*3/uL (ref 1.4–7.0)
Neutrophils: 50 %
Platelets: 355 10*3/uL (ref 150–450)
RBC: 4.75 x10E6/uL (ref 3.77–5.28)
RDW: 12.4 % (ref 11.7–15.4)
WBC: 8.5 10*3/uL (ref 3.4–10.8)

## 2024-01-25 LAB — HEMOGLOBIN A1C
Est. average glucose Bld gHb Est-mCnc: 111 mg/dL
Hgb A1c MFr Bld: 5.5 % (ref 4.8–5.6)

## 2024-01-25 LAB — VITAMIN B12: Vitamin B-12: 400 pg/mL (ref 232–1245)

## 2024-01-25 LAB — VITAMIN D 25 HYDROXY (VIT D DEFICIENCY, FRACTURES): Vit D, 25-Hydroxy: 30.1 ng/mL (ref 30.0–100.0)

## 2024-01-25 LAB — TSH: TSH: 1.46 u[IU]/mL (ref 0.450–4.500)

## 2024-01-28 ENCOUNTER — Ambulatory Visit: Payer: Self-pay

## 2024-01-31 ENCOUNTER — Telehealth: Payer: Self-pay | Admitting: Family

## 2024-01-31 ENCOUNTER — Other Ambulatory Visit: Payer: Self-pay | Admitting: Family

## 2024-01-31 NOTE — Telephone Encounter (Signed)
 Patient called in stating her neighbor checked her blood sugar and it is 65. She said her legs have been hurting especially at her ankles.Advised her the leg pain is probably not coming from her blood sugar. She wants a Rx sent to the pharmacy for a glucometer.

## 2024-02-03 ENCOUNTER — Other Ambulatory Visit: Payer: Self-pay

## 2024-02-03 MED ORDER — ACCU-CHEK GUIDE W/DEVICE KIT: 1.0000 | PACK | Freq: Every day | 0 refills | Status: AC

## 2024-02-03 MED ORDER — ACCU-CHEK SOFTCLIX LANCETS MISC: 12 refills | Status: AC

## 2024-02-03 MED ORDER — ACCU-CHEK GUIDE TEST VI STRP: ORAL_STRIP | 12 refills | Status: AC

## 2024-04-18 ENCOUNTER — Encounter: Payer: Self-pay | Admitting: Family

## 2024-04-18 NOTE — Assessment & Plan Note (Signed)
 Patient is seen by Ortho, who manage these condition.  She is well controlled with current therapy.   Will defer to them for further changes to plan of care.

## 2024-04-18 NOTE — Assessment & Plan Note (Addendum)
 Checking labs today Will call with results when available.

## 2024-04-18 NOTE — Assessment & Plan Note (Signed)
 Checking labs today.  Continue current therapy for lipid control. Will modify as needed based on labwork results.   -CMP w/eGFR -Lipid Panel

## 2024-04-18 NOTE — Progress Notes (Signed)
 Established Patient Office Visit  Subjective:  Patient ID: Sandra Reeves, female    DOB: 29-Mar-1976  Age: 48 y.o. MRN: [id number]  Chief Complaint  Patient presents with   Follow-up    BP issues    Patient is here today for her  follow up.  She has been feeling fairly well since last appointment.   She does have additional concerns to discuss today.  She says that her blood pressure has been low at other office visits, says they asked her to come in to see us  and make sure it was okay.   Labs are due today.  She needs refills.   I have reviewed her active problem list, medication list, allergies, notes from last encounter, lab results for her appointment today.    No other concerns at this time.   Past Medical History:  Diagnosis Date   Chest pain 12/19/2022   Osteoporosis     Past Surgical History:  Procedure Laterality Date   ABDOMINAL HYSTERECTOMY      Social History   Socioeconomic History   Marital status: Single    Spouse name: Not on file   Number of children: Not on file   Years of education: Not on file   Highest education level: Not on file  Occupational History   Not on file  Tobacco Use   Smoking status: Heavy Smoker    Current packs/day: 1.00    Types: Cigarettes   Smokeless tobacco: Never  Substance and Sexual Activity   Alcohol use: No   Drug use: Not Currently   Sexual activity: Not on file  Other Topics Concern   Not on file  Social History Narrative   Not on file   Social Drivers of Health   Financial Resource Strain: Low Risk  (04/14/2024)   Received from [facility]   Overall Financial Resource Strain (CARDIA)    How hard is it for you to pay for the very basics like food, housing, medical care, and heating?: Not hard at all  Food Insecurity: No Food Insecurity (04/14/2024)   Received from [facility]   Hunger Vital Sign    Within the past 12 months, you worried that your food would run out before you got the money to buy  more.: Never true    Within the past 12 months, the food you bought just didn't last and you didn't have money to get more.: Never true  Transportation Needs: No Transportation Needs (04/14/2024)   Received from [facility] - Transportation    In the past 12 months, has lack of transportation kept you from medical appointments or from getting medications?: No    In the past 12 months, has lack of transportation kept you from meetings, work, or from getting things needed for daily living?: No  Physical Activity: Not on file  Stress: Not on file  Social Connections: Not on file  Intimate Partner Violence: Not on file    No family history on file.  Allergies  Allergen Reactions   Duloxetine Other (See Comments)    Dehydration, muscle contraction in hands, vomit  Other reaction(s): Unknown  Dehydration, muscle contraction in hands, vomit    Other reaction(s): Unknown Dehydration, muscle contraction in hands, vomit    Dehydration, muscle contraction in hands, vomit Other reaction(s): Unknown Dehydration, muscle contraction in hands, vomit    Review of Systems  All other systems reviewed and are negative.      Objective:  BP 112/80   Pulse 65   Ht 5' (1.524 m)   Wt 98 lb (44.5 kg)   SpO2 98%   BMI 19.14 kg/m   Vitals:   01/24/24 1258  BP: 112/80  Pulse: 65  Height: 5' (1.524 m)  Weight: 98 lb (44.5 kg)  SpO2: 98%  BMI (Calculated): 19.14    Physical Exam Vitals and nursing note reviewed.  Constitutional:      General: She is awake.     Appearance: Normal appearance. She is well-developed and normal weight.  HENT:     Head: Normocephalic.  Eyes:     Extraocular Movements: Extraocular movements intact.     Conjunctiva/sclera: Conjunctivae normal.     Pupils: Pupils are equal, round, and reactive to light.  Cardiovascular:     Rate and Rhythm: Normal rate.  Pulmonary:     Effort: Pulmonary effort is normal.  Neurological:     General: No focal  deficit present.     Mental Status: She is alert and oriented to person, place, and time. Mental status is at baseline.  Psychiatric:        Mood and Affect: Mood normal.        Behavior: Behavior normal. Behavior is cooperative.        Thought Content: Thought content normal.        Judgment: Judgment normal.      Results for orders placed or performed in visit on 01/24/24  Lipid panel  Result Value Ref Range   Cholesterol, Total 207 (H) 100 - 199 mg/dL   Triglycerides 881 0 - 149 mg/dL   HDL 54 >60 mg/dL   VLDL Cholesterol Cal 21 5 - 40 mg/dL   LDL Chol Calc (NIH) 867 (H) 0 - 99 mg/dL   Chol/HDL Ratio 3.8 0.0 - 4.4 ratio  VITAMIN D  25 Hydroxy (Vit-D Deficiency, Fractures)  Result Value Ref Range   Vit D, 25-Hydroxy 30.1 30.0 - 100.0 ng/mL  CMP14+EGFR  Result Value Ref Range   Glucose 87 70 - 99 mg/dL   BUN 8 6 - 24 mg/dL   Creatinine, Ser 9.14 0.57 - 1.00 mg/dL   eGFR 86 >40 fO/fpw/8.26   BUN/Creatinine Ratio 9 9 - 23   Sodium 142 134 - 144 mmol/L   Potassium 3.9 3.5 - 5.2 mmol/L   Chloride 103 96 - 106 mmol/L   CO2 20 20 - 29 mmol/L   Calcium 10.2 8.7 - 10.2 mg/dL   Total Protein 7.3 6.0 - 8.5 g/dL   Albumin 4.7 3.9 - 4.9 g/dL   Globulin, Total 2.6 1.5 - 4.5 g/dL   Bilirubin Total 0.4 0.0 - 1.2 mg/dL   Alkaline Phosphatase 173 (H) 44 - 121 IU/L   AST 14 0 - 40 IU/L   ALT 7 0 - 32 IU/L  TSH  Result Value Ref Range   TSH 1.460 0.450 - 4.500 uIU/mL  Hemoglobin A1c  Result Value Ref Range   Hgb A1c MFr Bld 5.5 4.8 - 5.6 %   Est. average glucose Bld gHb Est-mCnc 111 mg/dL  Vitamin B12  Result Value Ref Range   Vitamin B-12 400 232 - 1,245 pg/mL  CBC with Diff  Result Value Ref Range   WBC 8.5 3.4 - 10.8 x10E3/uL   RBC 4.75 3.77 - 5.28 x10E6/uL   Hemoglobin 15.3 11.1 - 15.9 g/dL   Hematocrit 56.2 65.9 - 46.6 %   MCV 92 79 - 97 fL   MCH 32.2 26.6 -  33.0 pg   MCHC 35.0 31.5 - 35.7 g/dL   RDW 87.5 88.2 - 84.5 %   Platelets 355 150 - 450 x10E3/uL   Neutrophils  50 Not Estab. %   Lymphs 38 Not Estab. %   Monocytes 9 Not Estab. %   Eos 2 Not Estab. %   Basos 1 Not Estab. %   Neutrophils Absolute 4.3 1.4 - 7.0 x10E3/uL   Lymphocytes Absolute 3.2 (H) 0.7 - 3.1 x10E3/uL   Monocytes Absolute 0.8 0.1 - 0.9 x10E3/uL   EOS (ABSOLUTE) 0.2 0.0 - 0.4 x10E3/uL   Basophils Absolute 0.0 0.0 - 0.2 x10E3/uL   Immature Granulocytes 0 Not Estab. %   Immature Grans (Abs) 0.0 0.0 - 0.1 x10E3/uL    Recent Results (from the past 2160 hours)  Lipid panel     Status: Abnormal   Collection Time: 01/24/24  1:34 PM  Result Value Ref Range   Cholesterol, Total 207 (H) 100 - 199 mg/dL   Triglycerides 881 0 - 149 mg/dL   HDL 54 >60 mg/dL   VLDL Cholesterol Cal 21 5 - 40 mg/dL   LDL Chol Calc (NIH) 867 (H) 0 - 99 mg/dL   Chol/HDL Ratio 3.8 0.0 - 4.4 ratio    Comment:                                   T. Chol/HDL Ratio                                             Men  Women                               1/2 Avg.Risk  3.4    3.3                                   Avg.Risk  5.0    4.4                                2X Avg.Risk  9.6    7.1                                3X Avg.Risk 23.4   11.0   VITAMIN D  25 Hydroxy (Vit-D Deficiency, Fractures)     Status: None   Collection Time: 01/24/24  1:34 PM  Result Value Ref Range   Vit D, 25-Hydroxy 30.1 30.0 - 100.0 ng/mL    Comment: Vitamin D  deficiency has been defined by the Institute of Medicine and an Endocrine Society practice guideline as a level of serum 25-OH vitamin D  less than 20 ng/mL (1,2). The Endocrine Society went on to further define vitamin D  insufficiency as a level between 21 and 29 ng/mL (2). 1. IOM (Institute of Medicine). 2010. Dietary reference    intakes for calcium and D. [place]: The    [employer]. 2. Holick MF, Binkley [place], Bischoff-Ferrari HA, et al.    Evaluation, treatment, and prevention of vitamin D     deficiency: an Endocrine Society clinical  practice    guideline.  JCEM. 2011 Jul; 96(7):1911-30.   CMP14+EGFR     Status: Abnormal   Collection Time: 01/24/24  1:34 PM  Result Value Ref Range   Glucose 87 70 - 99 mg/dL   BUN 8 6 - 24 mg/dL   Creatinine, Ser 9.14 0.57 - 1.00 mg/dL   eGFR 86 >40 fO/fpw/8.26   BUN/Creatinine Ratio 9 9 - 23   Sodium 142 134 - 144 mmol/L   Potassium 3.9 3.5 - 5.2 mmol/L   Chloride 103 96 - 106 mmol/L   CO2 20 20 - 29 mmol/L   Calcium 10.2 8.7 - 10.2 mg/dL   Total Protein 7.3 6.0 - 8.5 g/dL   Albumin 4.7 3.9 - 4.9 g/dL   Globulin, Total 2.6 1.5 - 4.5 g/dL   Bilirubin Total 0.4 0.0 - 1.2 mg/dL   Alkaline Phosphatase 173 (H) 44 - 121 IU/L   AST 14 0 - 40 IU/L   ALT 7 0 - 32 IU/L  TSH     Status: None   Collection Time: 01/24/24  1:34 PM  Result Value Ref Range   TSH 1.460 0.450 - 4.500 uIU/mL  Hemoglobin A1c     Status: None   Collection Time: 01/24/24  1:34 PM  Result Value Ref Range   Hgb A1c MFr Bld 5.5 4.8 - 5.6 %    Comment:          Prediabetes: 5.7 - 6.4          Diabetes: >6.4          Glycemic control for adults with diabetes: <7.0    Est. average glucose Bld gHb Est-mCnc 111 mg/dL  Vitamin A87     Status: None   Collection Time: 01/24/24  1:34 PM  Result Value Ref Range   Vitamin B-12 400 232 - 1,245 pg/mL  CBC with Diff     Status: Abnormal   Collection Time: 01/24/24  1:34 PM  Result Value Ref Range   WBC 8.5 3.4 - 10.8 x10E3/uL   RBC 4.75 3.77 - 5.28 x10E6/uL   Hemoglobin 15.3 11.1 - 15.9 g/dL   Hematocrit 56.2 65.9 - 46.6 %   MCV 92 79 - 97 fL   MCH 32.2 26.6 - 33.0 pg   MCHC 35.0 31.5 - 35.7 g/dL   RDW 87.5 88.2 - 84.5 %   Platelets 355 150 - 450 x10E3/uL   Neutrophils 50 Not Estab. %   Lymphs 38 Not Estab. %   Monocytes 9 Not Estab. %   Eos 2 Not Estab. %   Basos 1 Not Estab. %   Neutrophils Absolute 4.3 1.4 - 7.0 x10E3/uL   Lymphocytes Absolute 3.2 (H) 0.7 - 3.1 x10E3/uL   Monocytes Absolute 0.8 0.1 - 0.9 x10E3/uL   EOS (ABSOLUTE) 0.2 0.0 - 0.4 x10E3/uL   Basophils Absolute 0.0  0.0 - 0.2 x10E3/uL   Immature Granulocytes 0 Not Estab. %   Immature Grans (Abs) 0.0 0.0 - 0.1 x10E3/uL       Assessment & Plan RLS (restless legs syndrome) Checking labs today Will call with results when available.   Mixed hyperlipidemia Checking labs today.  Continue current therapy for lipid control. Will modify as needed based on labwork results.   -CMP w/eGFR -Lipid Panel  Chronic bilateral low back pain with bilateral sciatica Cervical radiculopathy Carpal tunnel syndrome, bilateral Patient is seen by Ortho, who manage these condition.  She is well controlled with current therapy.   Will  defer to them for further changes to plan of care.  Vitamin D  deficiency, unspecified B12 deficiency due to diet Iron deficiency Other fatigue Checking labs today.  Will continue supplements as needed.   - Vitamin D  - Vitamin B12 - TSH  Essential hypertension, benign Blood pressure well controlled with current medications.  Continue current therapy.  Will reassess at follow up.   - CBC w/Diff - CMP w/eGFR  Prediabetes A1C Continues to be in prediabetic ranges.  Will reassess at follow up after next lab check.  Patient counseled on dietary choices and verbalized understanding.   -CBC w/Diff -CMP w/eGFR -Hemoglobin A1C     Return in about 3 months (around 04/25/2024).   Total time spent: 20 minutes  ALAN CHRISTELLA ARRANT, FNP  01/24/2024   This document may have been prepared by [facility] Voice Recognition software and as such may include unintentional dictation errors.

## 2024-04-27 ENCOUNTER — Ambulatory Visit: Admitting: Family

## 2024-06-10 ENCOUNTER — Encounter: Payer: Self-pay | Admitting: Family

## 2024-06-10 ENCOUNTER — Ambulatory Visit: Admitting: Family

## 2024-06-10 VITALS — BP 124/84 | HR 49 | Ht 60.0 in | Wt 103.6 lb

## 2024-06-10 DIAGNOSIS — R7303 Prediabetes: Secondary | ICD-10-CM

## 2024-06-10 DIAGNOSIS — G8929 Other chronic pain: Secondary | ICD-10-CM

## 2024-06-10 DIAGNOSIS — E782 Mixed hyperlipidemia: Secondary | ICD-10-CM

## 2024-06-10 DIAGNOSIS — M5442 Lumbago with sciatica, left side: Secondary | ICD-10-CM

## 2024-06-10 DIAGNOSIS — E611 Iron deficiency: Secondary | ICD-10-CM

## 2024-06-10 DIAGNOSIS — G40209 Localization-related (focal) (partial) symptomatic epilepsy and epileptic syndromes with complex partial seizures, not intractable, without status epilepticus: Secondary | ICD-10-CM

## 2024-06-10 DIAGNOSIS — E559 Vitamin D deficiency, unspecified: Secondary | ICD-10-CM | POA: Insufficient documentation

## 2024-06-10 DIAGNOSIS — G2581 Restless legs syndrome: Secondary | ICD-10-CM

## 2024-06-10 DIAGNOSIS — R5383 Other fatigue: Secondary | ICD-10-CM

## 2024-06-10 DIAGNOSIS — M5441 Lumbago with sciatica, right side: Secondary | ICD-10-CM

## 2024-06-10 DIAGNOSIS — G43111 Migraine with aura, intractable, with status migrainosus: Secondary | ICD-10-CM

## 2024-06-10 DIAGNOSIS — E538 Deficiency of other specified B group vitamins: Secondary | ICD-10-CM | POA: Insufficient documentation

## 2024-06-10 DIAGNOSIS — I10 Essential (primary) hypertension: Secondary | ICD-10-CM | POA: Diagnosis not present

## 2024-06-10 DIAGNOSIS — M5412 Radiculopathy, cervical region: Secondary | ICD-10-CM

## 2024-06-10 NOTE — Assessment & Plan Note (Addendum)
-   blood work to be collected today. - continue vitamin supplementation as needed based on lab results.

## 2024-06-10 NOTE — Patient Instructions (Addendum)
 Request spinal tap be performed under fluoroscopy  Sending referral to endocrinology.  DO NOT take Advil, ibuprofen, Naproxen , Aleeve, or Aspirin.  You can continue to take Tylenol  and your Ajovy.

## 2024-06-10 NOTE — Assessment & Plan Note (Addendum)
-   Follow up with specialist as recommended. - will provide samples of Symbravo to see if it helps with patients migraines.

## 2024-06-10 NOTE — Progress Notes (Signed)
 Established Patient Office Visit  Subjective:  Patient ID: Sandra Reeves, female    DOB: 07/14/1976  Age: 48 y.o. MRN: [id number]  Chief Complaint  Patient presents with   Follow-up    Discuss referral    Patient is here today for a referral to Endocrinology.  She has been feeling poorly since last appointment.   She does have additional concerns to discuss today.  Labs are due today.  She does not need refills.   I have reviewed her active problem list, medication list, allergies, family history, social history, health maintenance, notes from last encounter, lab results, imaging for her appointment today.    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 Wants advice on if she should undergo this procedure or get a second opinion. Last CT in 2024 showed Partially empty sella turcica, and idiopathic ICH. Discussed benefits that could result from spinal tape such as reduction in migraines and reduction in pain symptoms. Encouraged a second opinion if the patient wanted to have one before undergoing a spinal tap. Also discussed her having a discussion with her spine specialist if he would do fluoroscopy during the procedure or if he used any source of imaging to reduce risks and enhance likely hood of successful spinal tap. Recently had eye exam performed and there was no increased optic pressure. She continues to report migraines daily that cause nausea and vomiting. Will provide samples of Symbravo to see if helps with patient's migraines.    No other concerns at this time.   Past Medical History:  Diagnosis Date   Chest pain 12/19/2022   Osteoporosis     Past Surgical History:  Procedure  Laterality Date   ABDOMINAL HYSTERECTOMY      Social History   Socioeconomic History   Marital status: Single    Spouse name: Not on file   Number of children: Not on file   Years of education: Not on file   Highest education level: Not on file  Occupational History   Not on file  Tobacco Use   Smoking status: Heavy Smoker    Current packs/day: 1.00    Types: Cigarettes   Smokeless tobacco: Never  Substance and Sexual Activity   Alcohol use: No   Drug use: Not Currently   Sexual activity: Not on file  Other Topics Concern   Not on file  Social History Narrative   Not on file   Social Drivers of Health   Financial Resource Strain: Low Risk  (04/14/2024)   Received from [facility]   Overall Financial Resource Strain (CARDIA)    How hard is it for you to pay for the very basics like food, housing, medical care, and heating?: Not hard at all  Food Insecurity: No Food Insecurity (04/14/2024)   Received from [facility]   Hunger Vital Sign    Within the past 12 months, you worried that your food would run out before you got the money to buy more.: Never true    Within the past 12 months, the food you bought just didn't last and you didn't have money to get more.: Never true  Transportation Needs: No Transportation Needs (04/14/2024)   Received from [facility]  PRAPARE - Transportation    In the past 12 months, has lack of transportation kept you from medical appointments or from getting medications?: No    In the past 12 months, has lack of transportation kept you from meetings, work, or from getting things needed for daily living?: No  Physical Activity: Not on file  Stress: Not on file  Social Connections: Not on file  Intimate Partner Violence: Not on file    History reviewed. No pertinent family history.  Allergies  Allergen Reactions   Duloxetine Other (See Comments)    Dehydration, muscle contraction in hands, vomit  Other reaction(s):  Unknown  Dehydration, muscle contraction in hands, vomit    Other reaction(s): Unknown Dehydration, muscle contraction in hands, vomit    Dehydration, muscle contraction in hands, vomit Other reaction(s): Unknown Dehydration, muscle contraction in hands, vomit    Review of Systems  Constitutional:  Negative for malaise/fatigue.  HENT: Negative.    Eyes:  Negative for blurred vision and pain.  Respiratory:  Negative for cough and shortness of breath.   Cardiovascular:  Negative for chest pain, palpitations, claudication and leg swelling.  Gastrointestinal:  Positive for nausea and vomiting. Negative for abdominal pain, blood in stool, constipation and diarrhea.  Genitourinary:  Negative for dysuria, frequency and urgency.  Musculoskeletal: Negative.   Skin: Negative.   Neurological:  Positive for headaches. Negative for dizziness, tingling and sensory change.  Endo/Heme/Allergies: Negative.   Psychiatric/Behavioral: Negative.         Objective:   BP 124/84   Pulse (!) 49   Ht 5' (1.524 m)   Wt 103 lb 9.6 oz (47 kg)   SpO2 99%   BMI 20.23 kg/m   Vitals:   06/10/24 1059  BP: 124/84  Pulse: (!) 49  Height: 5' (1.524 m)  Weight: 103 lb 9.6 oz (47 kg)  SpO2: 99%  BMI (Calculated): 20.23    Physical Exam Vitals and nursing note reviewed.  Constitutional:      Appearance: Normal appearance.  HENT:     Head: Normocephalic.  Eyes:     Extraocular Movements: Extraocular movements intact.     Pupils: Pupils are equal, round, and reactive to light.  Cardiovascular:     Rate and Rhythm: Normal rate and regular rhythm.     Pulses: Normal pulses.     Heart sounds: Normal heart sounds. No murmur heard. Pulmonary:     Effort: Pulmonary effort is normal. No respiratory distress.     Breath sounds: Normal breath sounds.  Abdominal:     General: There is no distension.     Tenderness: There is no abdominal tenderness.  Musculoskeletal:        General: No tenderness.  Normal range of motion.     Cervical back: Normal range of motion and neck supple.     Right lower leg: No edema.     Left lower leg: No edema.  Skin:    General: Skin is warm and dry.     Coloration: Skin is not jaundiced.     Findings: No erythema.  Neurological:     General: No focal deficit present.     Mental Status: She is alert and oriented to person, place, and time.  Psychiatric:        Mood and Affect: Mood normal.      No results found for any visits on 06/10/24.  No results found for this or any previous visit (from the past 2160 hours).  Assessment & Plan Mixed hyperlipidemia Essential hypertension, benign Prediabetes - Discussed healthy diet and exercise as tolerated. - blood work to be collected today.  B12 deficiency due to diet Vitamin D  deficiency, unspecified Iron deficiency Other fatigue RLS (restless legs syndrome) - blood work to be collected today. - continue vitamin supplementation as needed based on lab results.  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 - will provide samples of Symbravo to see if it helps with patients migraines.     Return in about 3 months (around 09/09/2024) for F/U.   Total time spent: 30 minutes  Oddis DELENA Cain, FNP  06/10/2024   This document may have been prepared by [facility] Voice Recognition software and as such may include unintentional dictation errors.

## 2024-06-10 NOTE — Assessment & Plan Note (Addendum)
-   Discussed healthy diet and exercise as tolerated. - blood work to be collected today.

## 2024-06-11 LAB — CMP14+EGFR
ALT: 10 IU/L (ref 0–32)
AST: 16 IU/L (ref 0–40)
Albumin: 4.3 g/dL (ref 3.9–4.9)
Alkaline Phosphatase: 149 IU/L — ABNORMAL HIGH (ref 41–116)
BUN/Creatinine Ratio: 8 — ABNORMAL LOW (ref 9–23)
BUN: 7 mg/dL (ref 6–24)
Bilirubin Total: 0.2 mg/dL (ref 0.0–1.2)
CO2: 23 mmol/L (ref 20–29)
Calcium: 9.7 mg/dL (ref 8.7–10.2)
Chloride: 104 mmol/L (ref 96–106)
Creatinine, Ser: 0.83 mg/dL (ref 0.57–1.00)
Globulin, Total: 2.5 g/dL (ref 1.5–4.5)
Glucose: 91 mg/dL (ref 70–99)
Potassium: 4.1 mmol/L (ref 3.5–5.2)
Sodium: 140 mmol/L (ref 134–144)
Total Protein: 6.8 g/dL (ref 6.0–8.5)
eGFR: 88 mL/min/1.73 (ref >59)

## 2024-06-11 LAB — LIPID PANEL
Chol/HDL Ratio: 4.1 ratio (ref 0.0–4.4)
Cholesterol, Total: 192 mg/dL (ref 100–199)
HDL: 47 mg/dL (ref >39)
LDL Chol Calc (NIH): 128 mg/dL — ABNORMAL HIGH (ref 0–99)
Triglycerides: 95 mg/dL (ref 0–149)
VLDL Cholesterol Cal: 17 mg/dL (ref 5–40)

## 2024-06-11 LAB — CBC WITH DIFFERENTIAL/PLATELET
Basophils Absolute: 0.1 x10E3/uL (ref 0.0–0.2)
Basos: 1 %
EOS (ABSOLUTE): 0.1 x10E3/uL (ref 0.0–0.4)
Eos: 2 %
Hematocrit: 42.6 % (ref 34.0–46.6)
Hemoglobin: 13.9 g/dL (ref 11.1–15.9)
Immature Grans (Abs): 0 x10E3/uL (ref 0.0–0.1)
Immature Granulocytes: 0 %
Lymphocytes Absolute: 3.2 x10E3/uL — ABNORMAL HIGH (ref 0.7–3.1)
Lymphs: 40 %
MCH: 30.7 pg (ref 26.6–33.0)
MCHC: 32.6 g/dL (ref 31.5–35.7)
MCV: 94 fL (ref 79–97)
Monocytes Absolute: 0.7 x10E3/uL (ref 0.1–0.9)
Monocytes: 8 %
Neutrophils Absolute: 4 x10E3/uL (ref 1.4–7.0)
Neutrophils: 49 %
Platelets: 358 x10E3/uL (ref 150–450)
RBC: 4.53 x10E6/uL (ref 3.77–5.28)
RDW: 12.5 % (ref 11.7–15.4)
WBC: 8.1 x10E3/uL (ref 3.4–10.8)

## 2024-06-11 LAB — HEMOGLOBIN A1C
Est. average glucose Bld gHb Est-mCnc: 111 mg/dL
Hgb A1c MFr Bld: 5.5 % (ref 4.8–5.6)

## 2024-06-11 LAB — VITAMIN D 25 HYDROXY (VIT D DEFICIENCY, FRACTURES): Vit D, 25-Hydroxy: 25.6 ng/mL — ABNORMAL LOW (ref 30.0–100.0)

## 2024-06-11 LAB — TSH: TSH: 0.984 u[IU]/mL (ref 0.450–4.500)

## 2024-06-11 LAB — VITAMIN B12: Vitamin B-12: 410 pg/mL (ref 232–1245)

## 2024-06-25 ENCOUNTER — Telehealth: Payer: Self-pay

## 2024-06-25 NOTE — Telephone Encounter (Signed)
 Pt called endocrinology to get an update on her referral. Endocrinology needs us  to refax the referral to Fax #: [phone]

## 2024-07-01 ENCOUNTER — Telehealth: Payer: Self-pay

## 2024-07-01 NOTE — Telephone Encounter (Signed)
 Pt is requesting a referral to [facility] endocrinology in [place].

## 2024-07-15 ENCOUNTER — Ambulatory Visit: Admitting: Family

## 2024-07-17 ENCOUNTER — Ambulatory Visit: Admitting: Family

## 2024-07-17 ENCOUNTER — Encounter: Payer: Self-pay | Admitting: Family

## 2024-07-17 VITALS — BP 110/80 | HR 72 | Ht 60.0 in | Wt 102.6 lb

## 2024-07-17 DIAGNOSIS — N951 Menopausal and female climacteric states: Secondary | ICD-10-CM

## 2024-07-17 DIAGNOSIS — M2550 Pain in unspecified joint: Secondary | ICD-10-CM

## 2024-07-17 DIAGNOSIS — Z0130 Encounter for examination of blood pressure without abnormal findings: Secondary | ICD-10-CM

## 2024-07-17 DIAGNOSIS — R5382 Chronic fatigue, unspecified: Secondary | ICD-10-CM

## 2024-07-17 DIAGNOSIS — Z8739 Personal history of other diseases of the musculoskeletal system and connective tissue: Secondary | ICD-10-CM | POA: Diagnosis not present

## 2024-07-17 DIAGNOSIS — R002 Palpitations: Secondary | ICD-10-CM | POA: Diagnosis not present

## 2024-07-17 DIAGNOSIS — M791 Myalgia, unspecified site: Secondary | ICD-10-CM | POA: Diagnosis not present

## 2024-07-17 MED ORDER — BUPROPION HCL ER (XL) 300 MG PO TB24: 300.0000 mg | ORAL_TABLET | Freq: Every day | ORAL | 3 refills | Status: DC

## 2024-07-17 NOTE — Progress Notes (Signed)
 "  Established Patient Office Visit  Subjective:  Patient ID: Sandra Reeves, female    DOB: Dec 03, 1975  Age: 48 y.o. MRN: [id number]  Chief Complaint  Patient presents with   Follow-up    Check hormone levels    Patient here today because she has concerns about her hormone levels.  She did have a hysterectomy several years ago, but she is having perimenopausal symptoms, including fatigue, hot flashes, joint pain.     No other concerns at this time.   Past Medical History:  Diagnosis Date   Chest pain 12/19/2022   Osteoporosis     Past Surgical History:  Procedure Laterality Date   ABDOMINAL HYSTERECTOMY      Social History   Socioeconomic History   Marital status: Single    Spouse name: Not on file   Number of children: Not on file   Years of education: Not on file   Highest education level: Not on file  Occupational History   Not on file  Tobacco Use   Smoking status: Heavy Smoker    Current packs/day: 1.00    Types: Cigarettes   Smokeless tobacco: Never  Substance and Sexual Activity   Alcohol use: No   Drug use: Not Currently   Sexual activity: Not on file  Other Topics Concern   Not on file  Social History Narrative   Not on file   Social Drivers of Health   Financial Resource Strain: Low Risk  (04/14/2024)   Received from [facility]   Overall Financial Resource Strain (CARDIA)    How hard is it for you to pay for the very basics like food, housing, medical care, and heating?: Not hard at all  Food Insecurity: No Food Insecurity (04/14/2024)   Received from [facility]   Hunger Vital Sign    Within the past 12 months, you worried that your food would run out before you got the money to buy more.: Never true    Within the past 12 months, the food you bought just didn't last and you didn't have money to get more.: Never true  Transportation Needs: No Transportation Needs (04/14/2024)   Received from [facility] - Transportation     In the past 12 months, has lack of transportation kept you from medical appointments or from getting medications?: No    In the past 12 months, has lack of transportation kept you from meetings, work, or from getting things needed for daily living?: No  Physical Activity: Not on file  Stress: Not on file  Social Connections: Not on file  Intimate Partner Violence: Not on file    No family history on file.  Allergies  Allergen Reactions   Duloxetine Other (See Comments)    Dehydration, muscle contraction in hands, vomit  Other reaction(s): Unknown  Dehydration, muscle contraction in hands, vomit    Other reaction(s): Unknown Dehydration, muscle contraction in hands, vomit    Dehydration, muscle contraction in hands, vomit Other reaction(s): Unknown Dehydration, muscle contraction in hands, vomit    Review of Systems  Constitutional:  Positive for malaise/fatigue.  Musculoskeletal:  Positive for joint pain and myalgias.  All other systems reviewed and are negative.      Objective:   BP 110/80   Pulse 72   Ht 5' (1.524 m)   Wt 102 lb 9.6 oz (46.5 kg)   SpO2 98%   BMI 20.04 kg/m   Vitals:   07/17/24 1008  BP: 110/80  Pulse: 72  Height: 5' (1.524 m)  Weight: 102 lb 9.6 oz (46.5 kg)  SpO2: 98%  BMI (Calculated): 20.04    Physical Exam Vitals and nursing note reviewed.  Constitutional:      Appearance: Normal appearance. She is normal weight.  HENT:     Head: Normocephalic.  Eyes:     Extraocular Movements: Extraocular movements intact.     Conjunctiva/sclera: Conjunctivae normal.     Pupils: Pupils are equal, round, and reactive to light.  Cardiovascular:     Rate and Rhythm: Normal rate.  Pulmonary:     Effort: Pulmonary effort is normal.  Neurological:     General: No focal deficit present.     Mental Status: She is alert and oriented to person, place, and time. Mental status is at baseline.  Psychiatric:        Mood and Affect: Mood normal.         Behavior: Behavior normal.        Thought Content: Thought content normal.      No results found for any visits on 07/17/24.  Recent Results (from the past 2160 hours)  CMP14+EGFR     Status: Abnormal   Collection Time: 06/10/24 11:40 AM  Result Value Ref Range   Glucose 91 70 - 99 mg/dL   BUN 7 6 - 24 mg/dL   Creatinine, Ser 9.16 0.57 - 1.00 mg/dL   eGFR 88 >40 fO/fpw/8.26   BUN/Creatinine Ratio 8 (L) 9 - 23   Sodium 140 134 - 144 mmol/L   Potassium 4.1 3.5 - 5.2 mmol/L   Chloride 104 96 - 106 mmol/L   CO2 23 20 - 29 mmol/L   Calcium 9.7 8.7 - 10.2 mg/dL   Total Protein 6.8 6.0 - 8.5 g/dL   Albumin 4.3 3.9 - 4.9 g/dL   Globulin, Total 2.5 1.5 - 4.5 g/dL   Bilirubin Total 0.2 0.0 - 1.2 mg/dL   Alkaline Phosphatase 149 (H) 41 - 116 IU/L    Comment:               **Please note reference interval change**   AST 16 0 - 40 IU/L   ALT 10 0 - 32 IU/L  Lipid panel     Status: Abnormal   Collection Time: 06/10/24 11:40 AM  Result Value Ref Range   Cholesterol, Total 192 100 - 199 mg/dL   Triglycerides 95 0 - 149 mg/dL   HDL 47 >60 mg/dL   VLDL Cholesterol Cal 17 5 - 40 mg/dL   LDL Chol Calc (NIH) 871 (H) 0 - 99 mg/dL   Chol/HDL Ratio 4.1 0.0 - 4.4 ratio    Comment:                                   T. Chol/HDL Ratio                                             Men  Women                               1/2 Avg.Risk  3.4    3.3  Avg.Risk  5.0    4.4                                2X Avg.Risk  9.6    7.1                                3X Avg.Risk 23.4   11.0   VITAMIN D  25 Hydroxy (Vit-D Deficiency, Fractures)     Status: Abnormal   Collection Time: 06/10/24 11:40 AM  Result Value Ref Range   Vit D, 25-Hydroxy 25.6 (L) 30.0 - 100.0 ng/mL    Comment: Vitamin D  deficiency has been defined by the Institute of Medicine and an Endocrine Society practice guideline as a level of serum 25-OH vitamin D  less than 20 ng/mL (1,2). The Endocrine Society went  on to further define vitamin D  insufficiency as a level between 21 and 29 ng/mL (2). 1. IOM (Institute of Medicine). 2010. Dietary reference    intakes for calcium and D. [place]: The    [employer]. 2. Holick MF, Binkley [place], Bischoff-Ferrari HA, et al.    Evaluation, treatment, and prevention of vitamin D     deficiency: an Endocrine Society clinical practice    guideline. JCEM. 2011 Jul; 96(7):1911-30.   Vitamin B12     Status: None   Collection Time: 06/10/24 11:40 AM  Result Value Ref Range   Vitamin B-12 410 232 - 1,245 pg/mL  CBC with Diff     Status: Abnormal   Collection Time: 06/10/24 11:40 AM  Result Value Ref Range   WBC 8.1 3.4 - 10.8 x10E3/uL   RBC 4.53 3.77 - 5.28 x10E6/uL   Hemoglobin 13.9 11.1 - 15.9 g/dL   Hematocrit 57.3 65.9 - 46.6 %   MCV 94 79 - 97 fL   MCH 30.7 26.6 - 33.0 pg   MCHC 32.6 31.5 - 35.7 g/dL   RDW 87.4 88.2 - 84.5 %   Platelets 358 150 - 450 x10E3/uL   Neutrophils 49 Not Estab. %   Lymphs 40 Not Estab. %   Monocytes 8 Not Estab. %   Eos 2 Not Estab. %   Basos 1 Not Estab. %   Neutrophils Absolute 4.0 1.4 - 7.0 x10E3/uL   Lymphocytes Absolute 3.2 (H) 0.7 - 3.1 x10E3/uL   Monocytes Absolute 0.7 0.1 - 0.9 x10E3/uL   EOS (ABSOLUTE) 0.1 0.0 - 0.4 x10E3/uL   Basophils Absolute 0.1 0.0 - 0.2 x10E3/uL   Immature Granulocytes 0 Not Estab. %   Immature Grans (Abs) 0.0 0.0 - 0.1 x10E3/uL  Hemoglobin A1c     Status: None   Collection Time: 06/10/24 11:40 AM  Result Value Ref Range   Hgb A1c MFr Bld 5.5 4.8 - 5.6 %    Comment:          Prediabetes: 5.7 - 6.4          Diabetes: >6.4          Glycemic control for adults with diabetes: <7.0    Est. average glucose Bld gHb Est-mCnc 111 mg/dL  TSH     Status: None   Collection Time: 06/10/24 11:40 AM  Result Value Ref Range   TSH 0.984 0.450 - 4.500 uIU/mL       Assessment & Plan Menopausal and female climacteric states Chronic fatigue Polyarthralgia Myalgia History of  joint swelling Palpitations  Checking hormone levels today.  Will call pt with results when available.  Determine next steps for this based on the     Return in about 2 weeks (around 07/31/2024).   Total time spent: 20 minutes  ALAN CHRISTELLA ARRANT, FNP  07/17/2024   This document may have been prepared by [facility] Voice Recognition software and as such may include unintentional dictation errors.  "

## 2024-07-30 ENCOUNTER — Ambulatory Visit: Payer: Self-pay

## 2024-07-30 LAB — ANA 12 PROFILE, DO ALL RDL
Anti-Cardiolipin Ab, IgA (RDL): <12 U/mL (ref <12)
Anti-Cardiolipin Ab, IgG (RDL): <15 GPL U/mL (ref <15)
Anti-Cardiolipin Ab, IgM (RDL): <13 [MPL'U]/mL (ref <13)
Anti-Centromere Ab (RDL): <1:40 {titer}
Anti-La (SS-B) Ab (RDL): <20 U (ref <20)
Anti-Nuclear Ab by IFA (RDL): POSITIVE — AB
Anti-Ro (SS-A) Ab (RDL): <20 U (ref <20)
Anti-Scl-70 Ab (RDL): <20 U (ref <20)
Anti-Sm Ab (RDL): <20 U (ref <20)
Anti-TPO Ab (RDL): <9 [IU]/mL (ref <9.0)
Anti-U1 RNP Ab (RDL): <20 U (ref <20)
Anti-dsDNA Ab by Farr(RDL): <8 [IU]/mL (ref <8.0)
C3 Complement (RDL): 146 mg/dL (ref 90–180)
C4 Complement (RDL): 25 mg/dL (ref 10–40)

## 2024-07-30 LAB — TESTOSTERONE,FREE AND TOTAL
Testosterone, Free: 0.4 pg/mL (ref 0.0–4.2)
Testosterone: 4 ng/dL (ref 4–50)

## 2024-07-30 LAB — FSH+PROG+E2+SHBG
Estradiol: <5 pg/mL
FSH: 99 m[IU]/mL
Progesterone: 0.1 ng/mL
Sex Hormone Binding: 101 nmol/L (ref 24.6–122.0)

## 2024-07-30 LAB — RHEUMATOID ARTHRITIS PROFILE
Cyclic Citrullin Peptide Ab: 7 U (ref 0–19)
Rheumatoid fact SerPl-aCnc: <10 [IU]/mL (ref <14.0)

## 2024-07-30 LAB — TSH+T4F+T3FREE
Free T4: 1.33 ng/dL (ref 0.82–1.77)
T3, Free: 3.4 pg/mL (ref 2.0–4.4)
TSH: 0.639 u[IU]/mL (ref 0.450–4.500)

## 2024-07-30 LAB — LUTEINIZING HORMONE: LH: 60.3 m[IU]/mL

## 2024-07-30 LAB — ANA TITER AND PATTERN: Speckled Pattern: 1:80 {titer} — ABNORMAL HIGH

## 2024-07-31 ENCOUNTER — Other Ambulatory Visit: Payer: Self-pay | Admitting: Family

## 2024-08-07 ENCOUNTER — Encounter: Payer: Self-pay | Admitting: Family

## 2024-08-07 ENCOUNTER — Ambulatory Visit: Payer: Self-pay | Admitting: Family

## 2024-08-07 ENCOUNTER — Ambulatory Visit: Admitting: Family

## 2024-08-07 VITALS — BP 102/78 | HR 74 | Ht 60.0 in | Wt 105.0 lb

## 2024-08-07 DIAGNOSIS — G894 Chronic pain syndrome: Secondary | ICD-10-CM

## 2024-08-07 DIAGNOSIS — I10 Essential (primary) hypertension: Secondary | ICD-10-CM

## 2024-08-07 DIAGNOSIS — Z131 Encounter for screening for diabetes mellitus: Secondary | ICD-10-CM

## 2024-08-07 DIAGNOSIS — E782 Mixed hyperlipidemia: Secondary | ICD-10-CM

## 2024-08-07 DIAGNOSIS — R5383 Other fatigue: Secondary | ICD-10-CM

## 2024-08-07 DIAGNOSIS — J069 Acute upper respiratory infection, unspecified: Secondary | ICD-10-CM

## 2024-08-07 LAB — POCT XPERT XPRESS SARS COVID-2/FLU/RSV
FLU A: NEGATIVE
FLU B: NEGATIVE
RSV RNA, PCR: NEGATIVE
SARS Coronavirus 2: NEGATIVE

## 2024-08-07 LAB — POCT RAPID STREP A (OFFICE): Rapid Strep A Screen: NEGATIVE

## 2024-08-07 MED ORDER — BUPROPION HCL ER (XL) 150 MG PO TB24: 150.0000 mg | ORAL_TABLET | Freq: Every day | ORAL | 1 refills | Status: AC

## 2024-08-07 NOTE — Progress Notes (Unsigned)
 Established Patient Office Visit  Subjective:  Patient ID: Sandra Reeves, female    DOB: 05-06-1976  Age: 48 y.o. MRN: [id number]  Chief Complaint  Patient presents with   Follow-up    2 week follow up    URI  This is a new problem. The current episode started in the past 7 days. The problem has been gradually worsening. There has been no fever. Associated symptoms include coughing, headaches, a plugged ear sensation, rhinorrhea and a sore throat.   She also had her labs drawn at her last appointment, hormone levels show that her Estrogen is very low, as expected, given her bilateral oophorectomy and hysterectomy in history.   She is having significant fatigue, joint pain, brain fog, malaise, and depressive symptoms, which we have not been able to find a cause for, asks if it's possible that she could be having these as a side effect of menopausal hormone changes.   No other concerns at this time.   Past Medical History:  Diagnosis Date   Chest pain 12/19/2022   Osteoporosis     Past Surgical History:  Procedure Laterality Date   ABDOMINAL HYSTERECTOMY      Social History   Socioeconomic History   Marital status: Single    Spouse name: Not on file   Number of children: Not on file   Years of education: Not on file   Highest education level: Not on file  Occupational History   Not on file  Tobacco Use   Smoking status: Heavy Smoker    Current packs/day: 1.00    Types: Cigarettes   Smokeless tobacco: Never  Substance and Sexual Activity   Alcohol use: No   Drug use: Not Currently   Sexual activity: Not on file  Other Topics Concern   Not on file  Social History Narrative   Not on file   Social Drivers of Health   Financial Resource Strain: Low Risk  (04/14/2024)   Received from [facility]   Overall Financial Resource Strain (CARDIA)    How hard is it for you to pay for the very basics like food, housing, medical care, and heating?: Not hard at all   Food Insecurity: No Food Insecurity (04/14/2024)   Received from [facility]   Hunger Vital Sign    Within the past 12 months, you worried that your food would run out before you got the money to buy more.: Never true    Within the past 12 months, the food you bought just didn't last and you didn't have money to get more.: Never true  Transportation Needs: No Transportation Needs (04/14/2024)   Received from [facility] - Transportation    In the past 12 months, has lack of transportation kept you from medical appointments or from getting medications?: No    In the past 12 months, has lack of transportation kept you from meetings, work, or from getting things needed for daily living?: No  Physical Activity: Not on file  Stress: Not on file  Social Connections: Not on file  Intimate Partner Violence: Not on file    No family history on file.  Allergies  Allergen Reactions   Duloxetine Other (See Comments)    Dehydration, muscle contraction in hands, vomit  Other reaction(s): Unknown  Dehydration, muscle contraction in hands, vomit    Other reaction(s): Unknown Dehydration, muscle contraction in hands, vomit    Dehydration, muscle contraction in hands, vomit Other reaction(s): Unknown Dehydration,  muscle contraction in hands, vomit    Review of Systems  HENT:  Positive for rhinorrhea and sore throat.   Respiratory:  Positive for cough.   Neurological:  Positive for headaches.  All other systems reviewed and are negative.      Objective:   BP 102/78   Pulse 74   Ht 5' (1.524 m)   Wt 105 lb (47.6 kg)   SpO2 99%   BMI 20.51 kg/m   Vitals:   08/07/24 0856  BP: 102/78  Pulse: 74  Height: 5' (1.524 m)  Weight: 105 lb (47.6 kg)  SpO2: 99%  BMI (Calculated): 20.51    Physical Exam Vitals and nursing note reviewed.  Constitutional:      Appearance: Normal appearance. She is normal weight.  HENT:     Head: Normocephalic.  Eyes:     Extraocular  Movements: Extraocular movements intact.     Conjunctiva/sclera: Conjunctivae normal.     Pupils: Pupils are equal, round, and reactive to light.  Cardiovascular:     Rate and Rhythm: Normal rate.  Pulmonary:     Effort: Pulmonary effort is normal.  Neurological:     General: No focal deficit present.     Mental Status: She is alert and oriented to person, place, and time. Mental status is at baseline.  Psychiatric:        Mood and Affect: Mood normal.        Behavior: Behavior normal.        Thought Content: Thought content normal.      No results found for any visits on 08/07/24.  Recent Results (from the past 2160 hours)  CMP14+EGFR     Status: Abnormal   Collection Time: 06/10/24 11:40 AM  Result Value Ref Range   Glucose 91 70 - 99 mg/dL   BUN 7 6 - 24 mg/dL   Creatinine, Ser 9.16 0.57 - 1.00 mg/dL   eGFR 88 >40 fO/fpw/8.26   BUN/Creatinine Ratio 8 (L) 9 - 23   Sodium 140 134 - 144 mmol/L   Potassium 4.1 3.5 - 5.2 mmol/L   Chloride 104 96 - 106 mmol/L   CO2 23 20 - 29 mmol/L   Calcium 9.7 8.7 - 10.2 mg/dL   Total Protein 6.8 6.0 - 8.5 g/dL   Albumin 4.3 3.9 - 4.9 g/dL   Globulin, Total 2.5 1.5 - 4.5 g/dL   Bilirubin Total 0.2 0.0 - 1.2 mg/dL   Alkaline Phosphatase 149 (H) 41 - 116 IU/L    Comment:               **Please note reference interval change**   AST 16 0 - 40 IU/L   ALT 10 0 - 32 IU/L  Lipid panel     Status: Abnormal   Collection Time: 06/10/24 11:40 AM  Result Value Ref Range   Cholesterol, Total 192 100 - 199 mg/dL   Triglycerides 95 0 - 149 mg/dL   HDL 47 >60 mg/dL   VLDL Cholesterol Cal 17 5 - 40 mg/dL   LDL Chol Calc (NIH) 871 (H) 0 - 99 mg/dL   Chol/HDL Ratio 4.1 0.0 - 4.4 ratio    Comment:                                   T. Chol/HDL Ratio  Men  Women                               1/2 Avg.Risk  3.4    3.3                                   Avg.Risk  5.0    4.4                                2X  Avg.Risk  9.6    7.1                                3X Avg.Risk 23.4   11.0   VITAMIN D  25 Hydroxy (Vit-D Deficiency, Fractures)     Status: Abnormal   Collection Time: 06/10/24 11:40 AM  Result Value Ref Range   Vit D, 25-Hydroxy 25.6 (L) 30.0 - 100.0 ng/mL    Comment: Vitamin D  deficiency has been defined by the Institute of Medicine and an Endocrine Society practice guideline as a level of serum 25-OH vitamin D  less than 20 ng/mL (1,2). The Endocrine Society went on to further define vitamin D  insufficiency as a level between 21 and 29 ng/mL (2). 1. IOM (Institute of Medicine). 2010. Dietary reference    intakes for calcium and D. [place]: The    [employer]. 2. Holick MF, Binkley [place], Bischoff-Ferrari HA, et al.    Evaluation, treatment, and prevention of vitamin D     deficiency: an Endocrine Society clinical practice    guideline. JCEM. 2011 Jul; 96(7):1911-30.   Vitamin B12     Status: None   Collection Time: 06/10/24 11:40 AM  Result Value Ref Range   Vitamin B-12 410 232 - 1,245 pg/mL  CBC with Diff     Status: Abnormal   Collection Time: 06/10/24 11:40 AM  Result Value Ref Range   WBC 8.1 3.4 - 10.8 x10E3/uL   RBC 4.53 3.77 - 5.28 x10E6/uL   Hemoglobin 13.9 11.1 - 15.9 g/dL   Hematocrit 57.3 65.9 - 46.6 %   MCV 94 79 - 97 fL   MCH 30.7 26.6 - 33.0 pg   MCHC 32.6 31.5 - 35.7 g/dL   RDW 87.4 88.2 - 84.5 %   Platelets 358 150 - 450 x10E3/uL   Neutrophils 49 Not Estab. %   Lymphs 40 Not Estab. %   Monocytes 8 Not Estab. %   Eos 2 Not Estab. %   Basos 1 Not Estab. %   Neutrophils Absolute 4.0 1.4 - 7.0 x10E3/uL   Lymphocytes Absolute 3.2 (H) 0.7 - 3.1 x10E3/uL   Monocytes Absolute 0.7 0.1 - 0.9 x10E3/uL   EOS (ABSOLUTE) 0.1 0.0 - 0.4 x10E3/uL   Basophils Absolute 0.1 0.0 - 0.2 x10E3/uL   Immature Granulocytes 0 Not Estab. %   Immature Grans (Abs) 0.0 0.0 - 0.1 x10E3/uL  Hemoglobin A1c     Status: None   Collection Time: 06/10/24 11:40 AM  Result  Value Ref Range   Hgb A1c MFr Bld 5.5 4.8 - 5.6 %    Comment:          Prediabetes: 5.7 - 6.4          Diabetes: >6.4  Glycemic control for adults with diabetes: <7.0    Est. average glucose Bld gHb Est-mCnc 111 mg/dL  TSH     Status: None   Collection Time: 06/10/24 11:40 AM  Result Value Ref Range   TSH 0.984 0.450 - 4.500 uIU/mL  TSH+T4F+T3Free     Status: None   Collection Time: 07/17/24 11:04 AM  Result Value Ref Range   TSH 0.639 0.450 - 4.500 uIU/mL   T3, Free 3.4 2.0 - 4.4 pg/mL   Free T4 1.33 0.82 - 1.77 ng/dL  QDY+Emnh+Z7+DYAH     Status: None   Collection Time: 07/17/24 11:04 AM  Result Value Ref Range   FSH 99.0 mIU/mL    Comment:                      Adult Female             Range                       Follicular phase      3.5 -  12.5                       Ovulation phase       4.7 -  21.5                       Luteal phase          1.7 -   7.7                       Postmenopausal       25.8 - 134.8    Progesterone 0.1 ng/mL    Comment:                      Follicular phase       0.1 -   0.9                      Luteal phase           1.8 -  23.9                      Ovulation phase        0.1 -  12.0                      Pregnant                         First trimester    11.0 -  44.3                         Second trimester   25.4 -  83.3                         Third trimester    58.7 - 214.0                      Postmenopausal         0.0 -   0.1    Sex Hormone Binding 101.0 24.6 - 122.0 nmol/L   Estradiol <5.0 pg/mL    Comment:  Adult Female             Range                       Follicular phase     12.5 - 166.0                       Ovulation phase      85.8 - 498.0                       Luteal phase         43.8 - 211.0                       Postmenopausal       <6.0 -  54.7                      Pregnancy                       1st trimester     215.0 - >4300.0 Roche ECLIA methodology   LH     Status: None   Collection  Time: 07/17/24 11:04 AM  Result Value Ref Range   LH 60.3 mIU/mL    Comment:                      Adult Female              Range                       Follicular phase      2.4 -  12.6                       Ovulation phase      14.0 -  95.6                       Luteal phase          1.0 -  11.4                       Postmenopausal        7.7 -  58.5   Testosterone ,Free and Total     Status: None   Collection Time: 07/17/24 11:04 AM  Result Value Ref Range   Testosterone  4 4 - 50 ng/dL   Testosterone , Free 0.4 0.0 - 4.2 pg/mL  Rheumatoid Arthritis Profile     Status: None   Collection Time: 07/17/24 11:04 AM  Result Value Ref Range   Rheumatoid fact SerPl-aCnc <10.0 <14.0 IU/mL   Cyclic Citrullin Peptide Ab 7 0 - 19 units    Comment:                           Negative               <20                           Weak positive      20 - 39                           Moderate positive  40 -  59                           Strong positive        >59   ANA 12 Profile, Do All RDL     Status: Abnormal   Collection Time: 07/17/24 11:04 AM  Result Value Ref Range   Anti-Nuclear Ab by IFA (RDL) Positive (A) Negative   Anti-Centromere Ab (RDL) <1:40 <1:40   Anti-dsDNA Ab by Farr(RDL) <8.0 <8.0 IU/mL   Anti-Sm Ab (RDL) <20 <20 Units   Anti-U1 RNP Ab (RDL) <20 <20 Units   Anti-Ro (SS-A) Ab (RDL) <20 <20 Units   Anti-La (SS-B) Ab (RDL) <20 <20 Units   Anti-Scl-70 Ab (RDL) <20 <20 Units   Anti-Cardiolipin Ab, IgG (RDL) <15 <15 GPL U/mL   Anti-Cardiolipin Ab, IgA (RDL) <12 <12 APL U/mL   Anti-Cardiolipin Ab, IgM (RDL) <13 <13 MPL U/mL   C3 Complement (RDL) 146 90 - 180 mg/dL   C4 Complement (RDL) 25 10 - 40 mg/dL    Comment:               **Please note reference interval change**   Anti-TPO Ab (RDL) <9.0 <9.0 IU/mL    Comment:    Interpretation for Anti-Sm, Anti-U1 RNP, Anti-Ro,     Anti-La:         Negative:                                <20         Weak Positive:                       20  - 39         Moderate Positive:                   40 - 80         Strong Positive:                         >80         Strong Positive:                         >59    Interpretation for Anti-Cardiolipin Ab:     Negative:               GPL <15, APL <12, MPL <13     Indeterminate:    GPL 15-20, APL 12-20, MPL 13-20     Low Positive:           GPL, APL, MPL    >20 - 40     Med Positive:           GPL, APL, MPL    >40 - 80     High Positive:          GPL, APL, MPL         >80 SLE classification criteria are based on Med to High titer (>40) Anti-Cardiolipin Ab (aCL). aCL may be elevated transiently with certain infections and may increase spuriously in the presence of rheumatoid factor.   ANA Titer and Pattern     Status: Abnormal   Collection Time: 07/17/24 11:04 AM  Result Value Ref Range   Speckled Pattern 1:80 (H) <1:40   Note: Comment  Comment: ANA performed by Indirect Fluorescent Antibody (IFA)       Assessment & Plan Essential hypertension, benign Blood pressure well controlled with current medications.  Continue current therapy.  Will reassess at follow up.   Mixed hyperlipidemia Checking labs today.  Continue current therapy for lipid control. Will modify as needed based on labwork results.   Other fatigue Chronic pain syndrome Checking Cortisol level today.  Will await results and discuss at follow up appointment.  Acute upper respiratory infection COVID/Flu/RSV tests in office today all negative.  Strep test negative as well.   Pt will let me know if she is not improving by next week.      Return in about 1 month (around 09/06/2024) for F/U.   Total time spent: 30 minutes  ALAN CHRISTELLA ARRANT, FNP  08/07/2024   This document may have been prepared by [facility] Voice Recognition software and as such may include unintentional dictation errors.

## 2024-08-08 LAB — CORTISOL: Cortisol: 7.4 ug/dL (ref 6.2–19.4)

## 2024-08-08 NOTE — Assessment & Plan Note (Signed)
 Checking Cortisol level today.  Will await results and discuss at follow up appointment.

## 2024-08-08 NOTE — Assessment & Plan Note (Signed)
 Checking labs today.  Continue current therapy for lipid control. Will modify as needed based on labwork results.

## 2024-08-08 NOTE — Assessment & Plan Note (Signed)
 Blood pressure well controlled with current medications.  Continue current therapy.  Will reassess at follow up.

## 2024-09-03 MED ORDER — ONDANSETRON HCL 8 MG PO TABS: 8.0000 mg | ORAL_TABLET | Freq: Two times a day (BID) | ORAL | 2 refills | Status: AC | PRN

## 2024-09-06 ENCOUNTER — Other Ambulatory Visit: Payer: Self-pay | Admitting: Family

## 2024-09-14 ENCOUNTER — Ambulatory Visit: Admitting: Family

## 2024-09-14 ENCOUNTER — Encounter: Payer: Self-pay | Admitting: Family

## 2024-09-14 VITALS — BP 90/70 | HR 71 | Ht 60.0 in | Wt 103.0 lb

## 2024-09-14 DIAGNOSIS — N951 Menopausal and female climacteric states: Secondary | ICD-10-CM | POA: Diagnosis not present

## 2024-09-14 DIAGNOSIS — R7303 Prediabetes: Secondary | ICD-10-CM

## 2024-09-14 DIAGNOSIS — E349 Endocrine disorder, unspecified: Secondary | ICD-10-CM | POA: Diagnosis not present

## 2024-09-14 DIAGNOSIS — Z1231 Encounter for screening mammogram for malignant neoplasm of breast: Secondary | ICD-10-CM | POA: Diagnosis not present

## 2024-09-14 DIAGNOSIS — Z1159 Encounter for screening for other viral diseases: Secondary | ICD-10-CM | POA: Diagnosis not present

## 2024-09-14 DIAGNOSIS — M47812 Spondylosis without myelopathy or radiculopathy, cervical region: Secondary | ICD-10-CM | POA: Insufficient documentation

## 2024-09-14 DIAGNOSIS — E559 Vitamin D deficiency, unspecified: Secondary | ICD-10-CM | POA: Diagnosis not present

## 2024-09-14 DIAGNOSIS — I10 Essential (primary) hypertension: Secondary | ICD-10-CM

## 2024-09-14 DIAGNOSIS — E538 Deficiency of other specified B group vitamins: Secondary | ICD-10-CM | POA: Diagnosis not present

## 2024-09-14 DIAGNOSIS — E782 Mixed hyperlipidemia: Secondary | ICD-10-CM | POA: Diagnosis not present

## 2024-09-14 DIAGNOSIS — G43719 Chronic migraine without aura, intractable, without status migrainosus: Secondary | ICD-10-CM | POA: Insufficient documentation

## 2024-09-14 DIAGNOSIS — Z114 Encounter for screening for human immunodeficiency virus [HIV]: Secondary | ICD-10-CM | POA: Diagnosis not present

## 2024-09-14 DIAGNOSIS — Z9189 Other specified personal risk factors, not elsewhere classified: Secondary | ICD-10-CM

## 2024-09-14 DIAGNOSIS — E611 Iron deficiency: Secondary | ICD-10-CM | POA: Diagnosis not present

## 2024-09-14 DIAGNOSIS — R5383 Other fatigue: Secondary | ICD-10-CM | POA: Diagnosis not present

## 2024-09-14 MED ORDER — ESTRADIOL 0.025 MG/24HR TD PTWK: 0.0250 mg | MEDICATED_PATCH | TRANSDERMAL | 12 refills | Status: AC

## 2024-09-14 NOTE — Progress Notes (Signed)
 "  Established Patient Office Visit  Subjective:  Patient ID: Sandra Reeves, female    DOB: Jul 09, 1976  Age: 48 y.o. MRN: [id number]  Chief Complaint  Patient presents with   Follow-up    3 month follow up    Patient is here today for her 3 months follow up.  She has been feeling fairly well since last appointment.   She does have additional concerns to discuss today.   Labs are due today.  She needs refills.   I have reviewed her active problem list, medication list, allergies, health maintenance, notes from last encounter, lab results for her appointment today.      No other concerns at this time.   Past Medical History:  Diagnosis Date   Chest pain 12/19/2022   Osteoporosis     Past Surgical History:  Procedure Laterality Date   ABDOMINAL HYSTERECTOMY      Social History   Socioeconomic History   Marital status: Single    Spouse name: Not on file   Number of children: Not on file   Years of education: Not on file   Highest education level: Not on file  Occupational History   Not on file  Tobacco Use   Smoking status: Heavy Smoker    Current packs/day: 1.00    Types: Cigarettes   Smokeless tobacco: Never  Substance and Sexual Activity   Alcohol use: No   Drug use: Not Currently   Sexual activity: Not on file  Other Topics Concern   Not on file  Social History Narrative   Not on file   Social Drivers of Health   Tobacco Use: High Risk (09/14/2024)   Patient History    Smoking Tobacco Use: Heavy Smoker    Smokeless Tobacco Use: Never    Passive Exposure: Not on file  Financial Resource Strain: Low Risk (04/14/2024)   Received from [facility]   Overall Financial Resource Strain (CARDIA)    How hard is it for you to pay for the very basics like food, housing, medical care, and heating?: Not hard at all  Food Insecurity: No Food Insecurity (04/14/2024)   Received from [facility]   Epic    Within the past 12 months, you worried that your  food would run out before you got the money to buy more.: Never true    Within the past 12 months, the food you bought just didn't last and you didn't have money to get more.: Never true  Transportation Needs: No Transportation Needs (04/14/2024)   Received from [facility]    In the past 12 months, has lack of transportation kept you from medical appointments or from getting medications?: No    In the past 12 months, has lack of transportation kept you from meetings, work, or from getting things needed for daily living?: No  Physical Activity: Not on file  Stress: Not on file  Social Connections: Not on file  Intimate Partner Violence: Not on file  Depression (EYV7-0): Not on file  Alcohol Screen: Not on file  Housing: Low Risk (04/14/2024)   Received from [facility]    In the last 12 months, was there a time when you were not able to pay the mortgage or rent on time?: No    In the past 12 months, how many times have you moved where you were living?: 0    At any time in the past 12 months, were you  homeless or living in a shelter (including now)?: No  Utilities: Not At Risk (04/14/2024)   Received from [facility]    In the past 12 months has the electric, gas, oil, or water company threatened to shut off services in your home?: No  Health Literacy: Not on file    No family history on file.  Allergies[1]  Review of Systems  All other systems reviewed and are negative.      Objective:   BP 90/70   Pulse 71   Ht 5' (1.524 m)   Wt 103 lb (46.7 kg)   SpO2 99%   BMI 20.12 kg/m   Vitals:   09/14/24 1042  BP: 90/70  Pulse: 71  Height: 5' (1.524 m)  Weight: 103 lb (46.7 kg)  SpO2: 99%  BMI (Calculated): 20.12    Physical Exam Vitals and nursing note reviewed.  Constitutional:      Appearance: Normal appearance. She is normal weight.  HENT:     Head: Normocephalic.  Eyes:     Extraocular Movements: Extraocular movements intact.      Conjunctiva/sclera: Conjunctivae normal.     Pupils: Pupils are equal, round, and reactive to light.  Cardiovascular:     Rate and Rhythm: Normal rate.  Pulmonary:     Effort: Pulmonary effort is normal.  Neurological:     General: No focal deficit present.     Mental Status: She is alert and oriented to person, place, and time. Mental status is at baseline.  Psychiatric:        Mood and Affect: Mood normal.        Behavior: Behavior normal.        Thought Content: Thought content normal.      No results found for any visits on 09/14/24.  Recent Results (from the past 2160 hours)  TSH+T4F+T3Free     Status: None   Collection Time: 07/17/24 11:04 AM  Result Value Ref Range   TSH 0.639 0.450 - 4.500 uIU/mL   T3, Free 3.4 2.0 - 4.4 pg/mL   Free T4 1.33 0.82 - 1.77 ng/dL  QDY+Emnh+Z7+DYAH     Status: None   Collection Time: 07/17/24 11:04 AM  Result Value Ref Range   FSH 99.0 mIU/mL    Comment:                      Adult Female             Range                       Follicular phase      3.5 -  12.5                       Ovulation phase       4.7 -  21.5                       Luteal phase          1.7 -   7.7                       Postmenopausal       25.8 - 134.8    Progesterone 0.1 ng/mL    Comment:                      Follicular phase  0.1 -   0.9                      Luteal phase           1.8 -  23.9                      Ovulation phase        0.1 -  12.0                      Pregnant                         First trimester    11.0 -  44.3                         Second trimester   25.4 -  83.3                         Third trimester    58.7 - 214.0                      Postmenopausal         0.0 -   0.1    Sex Hormone Binding 101.0 24.6 - 122.0 nmol/L   Estradiol  <5.0 pg/mL    Comment:                      Adult Female             Range                       Follicular phase     12.5 - 166.0                       Ovulation phase      85.8 - 498.0                        Luteal phase         43.8 - 211.0                       Postmenopausal       <6.0 -  54.7                      Pregnancy                       1st trimester     215.0 - >4300.0 Roche ECLIA methodology   LH     Status: None   Collection Time: 07/17/24 11:04 AM  Result Value Ref Range   LH 60.3 mIU/mL    Comment:                      Adult Female              Range                       Follicular phase      2.4 -  12.6                       Ovulation phase  14.0 -  95.6                       Luteal phase          1.0 -  11.4                       Postmenopausal        7.7 -  58.5   Testosterone ,Free and Total     Status: None   Collection Time: 07/17/24 11:04 AM  Result Value Ref Range   Testosterone  4 4 - 50 ng/dL   Testosterone , Free 0.4 0.0 - 4.2 pg/mL  Rheumatoid Arthritis Profile     Status: None   Collection Time: 07/17/24 11:04 AM  Result Value Ref Range   Rheumatoid fact SerPl-aCnc <10.0 <14.0 IU/mL   Cyclic Citrullin Peptide Ab 7 0 - 19 units    Comment:                           Negative               <20                           Weak positive      20 - 39                           Moderate positive  40 - 59                           Strong positive        >59   ANA 12 Profile, Do All RDL     Status: Abnormal   Collection Time: 07/17/24 11:04 AM  Result Value Ref Range   Anti-Nuclear Ab by IFA (RDL) Positive (A) Negative   Anti-Centromere Ab (RDL) <1:40 <1:40   Anti-dsDNA Ab by Farr(RDL) <8.0 <8.0 IU/mL   Anti-Sm Ab (RDL) <20 <20 Units   Anti-U1 RNP Ab (RDL) <20 <20 Units   Anti-Ro (SS-A) Ab (RDL) <20 <20 Units   Anti-La (SS-B) Ab (RDL) <20 <20 Units   Anti-Scl-70 Ab (RDL) <20 <20 Units   Anti-Cardiolipin Ab, IgG (RDL) <15 <15 GPL U/mL   Anti-Cardiolipin Ab, IgA (RDL) <12 <12 APL U/mL   Anti-Cardiolipin Ab, IgM (RDL) <13 <13 MPL U/mL   C3 Complement (RDL) 146 90 - 180 mg/dL   C4 Complement (RDL) 25 10 - 40 mg/dL    Comment:                **Please note reference interval change**   Anti-TPO Ab (RDL) <9.0 <9.0 IU/mL    Comment:    Interpretation for Anti-Sm, Anti-U1 RNP, Anti-Ro,     Anti-La:         Negative:                                <20         Weak Positive:                       20 - 39         Moderate Positive:  40 - 80         Strong Positive:                         >80         Strong Positive:                         >59    Interpretation for Anti-Cardiolipin Ab:     Negative:               GPL <15, APL <12, MPL <13     Indeterminate:    GPL 15-20, APL 12-20, MPL 13-20     Low Positive:           GPL, APL, MPL    >20 - 40     Med Positive:           GPL, APL, MPL    >40 - 80     High Positive:          GPL, APL, MPL         >80 SLE classification criteria are based on Med to High titer (>40) Anti-Cardiolipin Ab (aCL). aCL may be elevated transiently with certain infections and may increase spuriously in the presence of rheumatoid factor.   ANA Titer and Pattern     Status: Abnormal   Collection Time: 07/17/24 11:04 AM  Result Value Ref Range   Speckled Pattern 1:80 (H) <1:40   Note: Comment     Comment: ANA performed by Indirect Fluorescent Antibody (IFA)  Cortisol     Status: None   Collection Time: 08/07/24  9:48 AM  Result Value Ref Range   Cortisol 7.4 6.2 - 19.4 ug/dL    Comment: Please Note: The reference interval and flagging for  this test is for an AM collection. If this is a PM  collection please use:         Cortisol PM: 2.3-11.9   POCT rapid strep A     Status: None   Collection Time: 08/07/24 10:01 AM  Result Value Ref Range   Rapid Strep A Screen Negative Negative  POCT XPERT XPRESS SARS COVID-2/FLU/RSV     Status: None   Collection Time: 08/07/24 10:02 AM  Result Value Ref Range   SARS Coronavirus 2 Negative    FLU A Negative    FLU B Negative    RSV RNA, PCR Negative        Assessment & Plan High risk for colon cancer Setting patient up for referral to  gastroenterology .  Will defer to them for further treatment changes.  Reassess at follow up.  Menopausal and female climacteric states Starting patient on estradiol  patches.  Will evaluate effectiveness.   Prediabetes A1C Continues to be in prediabetic ranges.  Will reassess at follow up after next lab check.  Patient counseled on dietary choices and verbalized understanding.   -CBC w/Diff -CMP w/eGFR -Hemoglobin A1C  Vitamin D  deficiency, unspecified B12 deficiency due to diet Other fatigue Iron deficiency Checking labs today.  Will continue supplements as needed.   - Vitamin D  - Vitamin B12 - TSH - Iron labs  Mixed hyperlipidemia Checking labs today.  Continue current therapy for lipid control. Will modify as needed based on labwork results.   -CMP w/eGFR -Lipid Panel  Essential hypertension, benign Blood pressure well controlled with current medications.  Continue current therapy.  Will reassess at follow up.   -  CBC w/Diff - CMP w/eGFR  Need for hepatitis C screening test Screening for HIV without presence of risk factors Tests ordered today.  Will call with results.   Screening mammogram for breast cancer Mammogram ordered today.  Pt aware they will call to schedule.      Return in about 3 months (around 12/13/2024).   Total time spent: 30 minutes  ALAN CHRISTELLA ARRANT, FNP  09/14/2024   This document may have been prepared by [facility] Voice Recognition software and as such may include unintentional dictation errors.      [1]  Allergies Allergen Reactions   Duloxetine Other (See Comments)    Dehydration, muscle contraction in hands, vomit  Other reaction(s): Unknown  Dehydration, muscle contraction in hands, vomit    Other reaction(s): Unknown Dehydration, muscle contraction in hands, vomit    Dehydration, muscle contraction in hands, vomit Other reaction(s): Unknown Dehydration, muscle contraction in hands, vomit   "

## 2024-09-14 NOTE — Assessment & Plan Note (Signed)
 Checking labs today.  Will continue supplements as needed.   - Vitamin D  - Vitamin B12 - TSH - Iron  labs

## 2024-09-14 NOTE — Assessment & Plan Note (Signed)
 Blood pressure well controlled with current medications.  Continue current therapy.  Will reassess at follow up.   - CBC w/Diff - CMP w/eGFR

## 2024-09-14 NOTE — Patient Instructions (Signed)
 [facility] at [facility]  [address] [facility] [place],  [place]  [address]  Main: [phone]   [facility] -   [phone]

## 2024-09-14 NOTE — Assessment & Plan Note (Signed)
 Checking labs today.  Continue current therapy for lipid control. Will modify as needed based on labwork results.   -CMP w/eGFR -Lipid Panel

## 2024-09-14 NOTE — Assessment & Plan Note (Signed)
.  A1C Continues to be in prediabetic ranges.  Will reassess at follow up after next lab check.  Patient counseled on dietary choices and verbalized understanding.   -CBC w/Diff -CMP w/eGFR -Hemoglobin A1C

## 2024-09-15 LAB — CMP14+EGFR
ALT: 12 IU/L (ref 0–32)
AST: 17 IU/L (ref 0–40)
Albumin: 4.6 g/dL (ref 3.9–4.9)
Alkaline Phosphatase: 161 IU/L — ABNORMAL HIGH (ref 41–116)
BUN/Creatinine Ratio: 14 (ref 9–23)
BUN: 14 mg/dL (ref 6–24)
Bilirubin Total: 0.2 mg/dL (ref 0.0–1.2)
CO2: 23 mmol/L (ref 20–29)
Calcium: 10 mg/dL (ref 8.7–10.2)
Chloride: 101 mmol/L (ref 96–106)
Creatinine, Ser: 0.97 mg/dL (ref 0.57–1.00)
Globulin, Total: 2.9 g/dL (ref 1.5–4.5)
Glucose: 88 mg/dL (ref 70–99)
Potassium: 4.8 mmol/L (ref 3.5–5.2)
Sodium: 139 mmol/L (ref 134–144)
Total Protein: 7.5 g/dL (ref 6.0–8.5)
eGFR: 73 mL/min/1.73

## 2024-09-15 LAB — VITAMIN B12: Vitamin B-12: 469 pg/mL (ref 232–1245)

## 2024-09-15 LAB — LIPID PANEL
Chol/HDL Ratio: 4.4 ratio (ref 0.0–4.4)
Cholesterol, Total: 221 mg/dL — ABNORMAL HIGH (ref 100–199)
HDL: 50 mg/dL
LDL Chol Calc (NIH): 153 mg/dL — ABNORMAL HIGH (ref 0–99)
Triglycerides: 100 mg/dL (ref 0–149)
VLDL Cholesterol Cal: 18 mg/dL (ref 5–40)

## 2024-09-15 LAB — TSH: TSH: 0.685 u[IU]/mL (ref 0.450–4.500)

## 2024-09-15 LAB — HCV INTERPRETATION

## 2024-09-15 LAB — HEMOGLOBIN A1C
Est. average glucose Bld gHb Est-mCnc: 111 mg/dL
Hgb A1c MFr Bld: 5.5 % (ref 4.8–5.6)

## 2024-09-15 LAB — VITAMIN D 25 HYDROXY (VIT D DEFICIENCY, FRACTURES): Vit D, 25-Hydroxy: 34.8 ng/mL (ref 30.0–100.0)

## 2024-09-15 LAB — HIV ANTIBODY (ROUTINE TESTING W REFLEX): HIV Screen 4th Generation wRfx: NONREACTIVE

## 2024-09-15 LAB — HCV AB W REFLEX TO QUANT PCR: HCV Ab: NONREACTIVE

## 2024-10-01 ENCOUNTER — Ambulatory Visit: Payer: Self-pay

## 2024-10-06 ENCOUNTER — Other Ambulatory Visit: Payer: Self-pay | Admitting: Family

## 2024-10-18 ENCOUNTER — Encounter: Payer: Self-pay | Admitting: Family

## 2024-10-18 NOTE — Assessment & Plan Note (Signed)
 Checking hormone levels today.  Will call pt with results when available.  Determine next steps for this based on the

## 2024-12-14 ENCOUNTER — Ambulatory Visit: Admitting: Family
